# Patient Record
Sex: Male | Born: 1990 | Race: White | Hispanic: No | Marital: Single | Smoking: Current every day smoker
Health system: Southern US, Community
[De-identification: ages and names within clinical notes are randomized; demographics above are authoritative.]

## PROBLEM LIST (undated history)

## (undated) DIAGNOSIS — G2581 Restless legs syndrome: Secondary | ICD-10-CM

## (undated) DIAGNOSIS — Z7189 Other specified counseling: Secondary | ICD-10-CM

## (undated) DIAGNOSIS — M26609 Unspecified temporomandibular joint disorder, unspecified side: Secondary | ICD-10-CM

## (undated) DIAGNOSIS — E860 Dehydration: Secondary | ICD-10-CM

## (undated) DIAGNOSIS — F909 Attention-deficit hyperactivity disorder, unspecified type: Secondary | ICD-10-CM

## (undated) DIAGNOSIS — R5383 Other fatigue: Secondary | ICD-10-CM

## (undated) DIAGNOSIS — F319 Bipolar disorder, unspecified: Secondary | ICD-10-CM

## (undated) DIAGNOSIS — F419 Anxiety disorder, unspecified: Secondary | ICD-10-CM

## (undated) DIAGNOSIS — E785 Hyperlipidemia, unspecified: Secondary | ICD-10-CM

## (undated) DIAGNOSIS — R5381 Other malaise: Secondary | ICD-10-CM

## (undated) DIAGNOSIS — G43909 Migraine, unspecified, not intractable, without status migrainosus: Secondary | ICD-10-CM

## (undated) DIAGNOSIS — F411 Generalized anxiety disorder: Secondary | ICD-10-CM

## (undated) HISTORY — DX: Dehydration: E86.0

## (undated) HISTORY — DX: Other fatigue: R53.83

## (undated) HISTORY — DX: Generalized anxiety disorder: F41.1

## (undated) HISTORY — DX: Hyperlipidemia, unspecified: E78.5

## (undated) HISTORY — DX: Morbid (severe) obesity due to excess calories: E66.01

## (undated) HISTORY — DX: Unspecified temporomandibular joint disorder, unspecified side: M26.609

## (undated) HISTORY — DX: Other specified counseling: Z71.89

## (undated) HISTORY — DX: Migraine, unspecified, not intractable, without status migrainosus: G43.909

## (undated) HISTORY — DX: Other malaise: R53.81

## (undated) HISTORY — DX: Attention-deficit hyperactivity disorder, unspecified type: F90.9

## (undated) HISTORY — DX: Restless legs syndrome: G25.81

## (undated) HISTORY — DX: Hypercalcemia: E83.52

---
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
## 2009-03-20 ENCOUNTER — Emergency Department (HOSPITAL_COMMUNITY): Admission: EM | Admit: 2009-03-20 | Discharge: 2009-03-20 | Payer: Self-pay | Admitting: Emergency Medicine

## 2010-08-22 ENCOUNTER — Emergency Department (HOSPITAL_COMMUNITY): Admission: EM | Admit: 2010-08-22 | Discharge: 2010-08-22 | Payer: Self-pay | Admitting: Emergency Medicine

## 2010-09-20 ENCOUNTER — Ambulatory Visit: Payer: Self-pay | Admitting: Family Medicine

## 2010-09-20 DIAGNOSIS — F4323 Adjustment disorder with mixed anxiety and depressed mood: Secondary | ICD-10-CM

## 2010-09-20 DIAGNOSIS — F17200 Nicotine dependence, unspecified, uncomplicated: Secondary | ICD-10-CM

## 2010-09-30 ENCOUNTER — Ambulatory Visit: Payer: Self-pay | Admitting: Family Medicine

## 2010-09-30 DIAGNOSIS — F988 Other specified behavioral and emotional disorders with onset usually occurring in childhood and adolescence: Secondary | ICD-10-CM | POA: Insufficient documentation

## 2010-10-18 ENCOUNTER — Telehealth (INDEPENDENT_AMBULATORY_CARE_PROVIDER_SITE_OTHER): Payer: Self-pay | Admitting: *Deleted

## 2010-11-03 ENCOUNTER — Ambulatory Visit
Admission: RE | Admit: 2010-11-03 | Discharge: 2010-11-03 | Payer: Self-pay | Source: Home / Self Care | Attending: Family Medicine | Admitting: Family Medicine

## 2010-11-04 ENCOUNTER — Encounter: Payer: Self-pay | Admitting: Family Medicine
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
## 2010-11-22 NOTE — Progress Notes (Signed)
Summary: Mood Questionnaire  Mood Questionnaire   Imported By: Lester Wallowa 09/23/2010 11:25:52  _____________________________________________________________________  External Attachment:    Type:   Image     Comment:   External Document

## 2010-11-24 ENCOUNTER — Ambulatory Visit: Payer: Self-pay | Admitting: Family Medicine
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
## 2010-11-24 NOTE — Progress Notes (Signed)
Summary: Clonazepam refill  Phone Note Refill Request Message from:  Fax from Pharmacy  Refills Requested: Medication #1:  CLONAZEPAM 2 MG TBDP 1/2 tab by mouth two times a day   Dosage confirmed as above?Dosage Confirmed   Brand Name Necessary? No   Supply Requested: 1 month   Last Refilled: 09/20/2010  Method Requested: Electronic Next Appointment Scheduled: 11/01/10 Initial call taken by: Lannette Donath,  October 18, 2010 1:06 PM  Follow-up for Phone Call        Rx faxed to pharmacy Follow-up by: Michell Heinrich M.D.,  October 18, 2010 1:10 PM  Additional Follow-up for Phone Call Additional follow up Details #1::        rx faxed to [facility]. Additional Follow-up by: [name]),  October 18, 2010 2:32 PM

## 2010-11-25 ENCOUNTER — Ambulatory Visit: Payer: Self-pay | Admitting: Family Medicine

## 2010-11-28 ENCOUNTER — Encounter: Payer: Self-pay | Admitting: Family Medicine

## 2010-11-28 ENCOUNTER — Ambulatory Visit (INDEPENDENT_AMBULATORY_CARE_PROVIDER_SITE_OTHER): Payer: Self-pay | Admitting: Family Medicine

## 2010-11-28 DIAGNOSIS — F411 Generalized anxiety disorder: Secondary | ICD-10-CM

## 2010-11-28 DIAGNOSIS — F988 Other specified behavioral and emotional disorders with onset usually occurring in childhood and adolescence: Secondary | ICD-10-CM

## 2010-12-08 ENCOUNTER — Ambulatory Visit: Payer: Self-pay | Admitting: Family Medicine
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
## 2010-12-26 ENCOUNTER — Encounter: Payer: Self-pay | Admitting: Family Medicine

## 2010-12-26 ENCOUNTER — Ambulatory Visit (INDEPENDENT_AMBULATORY_CARE_PROVIDER_SITE_OTHER): Payer: BC Managed Care – PPO | Admitting: Family Medicine

## 2010-12-26 DIAGNOSIS — F411 Generalized anxiety disorder: Secondary | ICD-10-CM

## 2010-12-26 DIAGNOSIS — K5732 Diverticulitis of large intestine without perforation or abscess without bleeding: Secondary | ICD-10-CM

## 2010-12-27 ENCOUNTER — Ambulatory Visit: Payer: Self-pay | Admitting: Family Medicine
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
## 2011-01-04 LAB — DIFFERENTIAL
Basophils Absolute: 0 10*3/uL (ref 0.0–0.1)
Basophils Relative: 0 % (ref 0–1)
Monocytes Absolute: 0.5 10*3/uL (ref 0.1–1.0)
Neutro Abs: 3.4 10*3/uL (ref 1.7–7.7)
Neutrophils Relative %: 62 % (ref 43–77)

## 2011-01-04 LAB — POCT CARDIAC MARKERS
CKMB, poc: <1 ng/mL — ABNORMAL LOW (ref 1.0–8.0)
CKMB, poc: <1 ng/mL — ABNORMAL LOW (ref 1.0–8.0)

## 2011-01-04 LAB — RAPID URINE DRUG SCREEN, HOSP PERFORMED
Amphetamines: NOT DETECTED
Cocaine: NOT DETECTED
Opiates: NOT DETECTED
Tetrahydrocannabinol: POSITIVE — AB

## 2011-01-04 LAB — POCT I-STAT, CHEM 8
Glucose, Bld: 103 mg/dL — ABNORMAL HIGH (ref 70–99)
HCT: 45 % (ref 39.0–52.0)
Hemoglobin: 15.3 g/dL (ref 13.0–17.0)
Potassium: 4.1 mEq/L (ref 3.5–5.1)
Sodium: 138 mEq/L (ref 135–145)

## 2011-01-04 LAB — CBC
MCHC: 34.7 g/dL (ref 30.0–36.0)
Platelets: 190 10*3/uL (ref 150–400)
RDW: 13 % (ref 11.5–15.5)

## 2011-01-15 ENCOUNTER — Encounter: Payer: Self-pay | Admitting: Family Medicine

## 2011-01-25 ENCOUNTER — Other Ambulatory Visit: Payer: Self-pay | Admitting: Family Medicine

## 2011-01-26 ENCOUNTER — Ambulatory Visit (INDEPENDENT_AMBULATORY_CARE_PROVIDER_SITE_OTHER): Payer: BC Managed Care – PPO | Admitting: Family Medicine

## 2011-01-26 ENCOUNTER — Encounter: Payer: Self-pay | Admitting: Family Medicine

## 2011-01-26 VITALS — BP 122/83 | HR 63 | Temp 97.2°F | Ht 72.0 in | Wt 283.0 lb

## 2011-01-26 DIAGNOSIS — F411 Generalized anxiety disorder: Secondary | ICD-10-CM

## 2011-01-26 MED ORDER — LISDEXAMFETAMINE DIMESYLATE 50 MG PO CAPS: 50.0000 mg | ORAL_CAPSULE | ORAL | Status: DC

## 2011-01-26 MED ORDER — BUSPIRONE HCL 15 MG PO TABS: ORAL_TABLET | ORAL | Status: DC

## 2011-01-26 NOTE — Progress Notes (Addendum)
  Subjective:    Patient ID: Jason Flores, male    DOB: [birth year], 20 y.o.   MRN: [id number]  Anxiety Symptoms include decreased concentration, depressed mood (on and off for 2-3 days, quote "my bad days"), excessive worry, irritability, muscle tension, nervous/anxious behavior, panic (in the past but none lately) and restlessness. Patient reports no insomnia or suicidal ideas. Primary symptoms comment: Patient has been on increased dose of citalopram (40mg ) for 66mo now and feels no difference.  Has past history of failed trials of multiple SSRIs (some worsening irritability on some), and failed trials of seroquel xR and Abilify (plus wt gain)  in past. Symptoms occur constantly. The severity of symptoms is moderate. The quality of sleep is good.   Compliance with medications is 76-100%.      Review of Systems  Constitutional: Positive for irritability.  Psychiatric/Behavioral: Positive for decreased concentration. Negative for suicidal ideas. The patient is nervous/anxious. The patient does not have insomnia.       Objective:   Physical Exam Blood pressure 122/83, pulse 63, temperature 97.2 F (36.2 C), temperature source Oral, height 6' (1.829 m), weight 283 lb (128.368 kg), SpO2 95.00%. VS: noted, all normal. Gen: Alert, well appearing, oriented x 4. Calm, lucid thought and speech. No further exam today.       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 He has failed multiple SSRIs, was made worse by some.  Citalopram for 36mo trial has not made any changes for the better or worse. We discussed option of mood stabilizer trial but pt very apprehensive about potential wt gain side effect of the atypical antipsychotics (he gained wt on abilify and seroquel in the past).   We ultimately decided today on a trial of buspar 15mg  bid, recheck in 6  wks.  Continue clonazepam and vyvanse as currently prescribed, vyvanse rx printed today.

## 2011-01-26 NOTE — Assessment & Plan Note (Addendum)
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 He has failed multiple SSRIs, was made worse by some.  Citalopram for 74mo trial has not made any changes for the better or worse. We discussed option of mood stabilizer trial but pt very apprehensive about potential wt gain side effect of the atypical antipsychotics (he gained wt on abilify and seroquel in the past).   We ultimately decided today on a trial of buspar 15mg  bid, recheck in 6 wks.  Continue clonazepam and vyvanse as currently prescribed, vyvanse rx printed today.

## 2011-01-31 LAB — RAPID URINE DRUG SCREEN, HOSP PERFORMED
Amphetamines: NOT DETECTED
Barbiturates: NOT DETECTED
Cocaine: POSITIVE — AB
Opiates: NOT DETECTED
Tetrahydrocannabinol: POSITIVE — AB

## 2011-01-31 LAB — DIFFERENTIAL
Lymphocytes Relative: 31 % (ref 12–46)
Lymphs Abs: 1.7 10*3/uL (ref 0.7–4.0)
Monocytes Absolute: 0.5 10*3/uL (ref 0.1–1.0)
Monocytes Relative: 10 % (ref 3–12)
Neutro Abs: 3 10*3/uL (ref 1.7–7.7)

## 2011-01-31 LAB — URINALYSIS, ROUTINE W REFLEX MICROSCOPIC
Bilirubin Urine: NEGATIVE
Glucose, UA: NEGATIVE mg/dL
Hgb urine dipstick: NEGATIVE
Specific Gravity, Urine: 1.005 (ref 1.005–1.030)
pH: 6 (ref 5.0–8.0)

## 2011-01-31 LAB — CBC
Hemoglobin: 15.8 g/dL (ref 13.0–17.0)
MCHC: 35.2 g/dL (ref 30.0–36.0)
RBC: 5.08 MIL/uL (ref 4.22–5.81)
WBC: 5.4 10*3/uL (ref 4.0–10.5)

## 2011-01-31 LAB — BASIC METABOLIC PANEL
CO2: 25 mEq/L (ref 19–32)
Calcium: 9.7 mg/dL (ref 8.4–10.5)
GFR calc Af Amer: >60 mL/min (ref >60)
GFR calc non Af Amer: >60 mL/min (ref >60)
Sodium: 140 mEq/L (ref 135–145)

## 2011-01-31 LAB — HEPATIC FUNCTION PANEL
Alkaline Phosphatase: 91 U/L (ref 39–117)
Bilirubin, Direct: <0.1 mg/dL (ref 0.0–0.3)
Total Bilirubin: 0.6 mg/dL (ref 0.3–1.2)

## 2011-01-31 LAB — SALICYLATE LEVEL: Salicylate Lvl: <4 mg/dL (ref 2.8–20.0)

## 2011-02-23 ENCOUNTER — Other Ambulatory Visit: Payer: Self-pay | Admitting: *Deleted

## 2011-02-23 MED ORDER — LISDEXAMFETAMINE DIMESYLATE 50 MG PO CAPS: 50.0000 mg | ORAL_CAPSULE | ORAL | Status: DC

## 2011-02-23 NOTE — Telephone Encounter (Signed)
RX signed by Dr. Milinda Cave and placed at front desk.

## 2011-02-23 NOTE — Telephone Encounter (Signed)
Pt called to request refill.  Last filled 01/26/11.  Pt will pick up today or tomorrow.
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
## 2011-03-21 ENCOUNTER — Ambulatory Visit (INDEPENDENT_AMBULATORY_CARE_PROVIDER_SITE_OTHER): Payer: BC Managed Care – PPO | Admitting: Family Medicine

## 2011-03-21 ENCOUNTER — Encounter: Payer: Self-pay | Admitting: Family Medicine

## 2011-03-21 VITALS — BP 130/85 | HR 75 | Temp 97.7°F | Ht 72.0 in | Wt 280.0 lb

## 2011-03-21 DIAGNOSIS — L0293 Carbuncle, unspecified: Secondary | ICD-10-CM

## 2011-03-21 DIAGNOSIS — L0292 Furuncle, unspecified: Secondary | ICD-10-CM

## 2011-03-21 MED ORDER — LISDEXAMFETAMINE DIMESYLATE 50 MG PO CAPS: 50.0000 mg | ORAL_CAPSULE | ORAL | Status: DC

## 2011-03-21 MED ORDER — MUPIROCIN 2 % EX OINT: TOPICAL_OINTMENT | Freq: Three times a day (TID) | CUTANEOUS | Status: AC

## 2011-03-21 NOTE — Progress Notes (Signed)
OFFICE NOTE  03/21/2011  CC:  Chief Complaint  Patient presents with  . Cyst    midline chest, was not there when pt went to bed last night.     HPI:   Patient is a 20 y.o. Caucasian male who is here for bump on chest. Noted upon awakening this a.m., hurts, esp when touched.  No fever.  No insect/spider bite recalled. No similar spots on skin now or in the past. No known hx of MRSA +. Pertinent PMH:  GAD Adult ADHD--nearly due for rf.  MEDS;   Outpatient Prescriptions Prior to Visit  Medication Sig Dispense Refill  . acetaminophen (TYLENOL) 500 MG tablet Take 1,000 mg by mouth as needed.        . busPIRone (BUSPAR) 15 MG tablet 1 tab po bid  60 tablet  1  . clonazePAM (KLONOPIN) 2 MG tablet Take 1 mg by mouth 2 (two) times daily.        Marland Kitchen lisdexamfetamine (VYVANSE) 50 MG capsule Take 1 capsule (50 mg total) by mouth every morning.  30 capsule  0  . citalopram (CELEXA) 40 MG tablet Take 40 mg by mouth daily.          PE: Blood pressure 130/85, pulse 75, temperature 97.7 F (36.5 C), temperature source Oral, height 6' (1.829 m), weight 280 lb (127.007 kg). Gen: Alert, well appearing.  Patient is oriented to person, place, time, and situation. SKIN: central chest with 2mm eryth papule with about a pea sized nodule palpable beneath this--indistinct.  Mild/mod TTP here.  No surrounding erythema, no streaking.  No drainage.    IMPRESSION AND PLAN:  Furuncle Early in the process, no I&D indicated at this time. Discussed importance of heat application to encourage drainage. Bactroban ointment rx'd for tid application to the site x 10d. Call or return if not improving in 2-3d or if develops fever or if unsure.   Also, patient nearly due to come back and pick up routine vyvanse rx RF so I printed this out for him today.  FOLLOW UP:  Return if symptoms worsen or fail to improve.

## 2011-03-21 NOTE — Assessment & Plan Note (Signed)
Early in the process, no I&D indicated at this time. Discussed importance of heat application to encourage drainage. Bactroban ointment rx'd for tid application to the site x 10d. Call or return if not improving in 2-3d or if develops fever or if unsure.

## 2011-03-21 NOTE — Patient Instructions (Signed)
Apply warm/hot pack against the area for 20 minutes at least 2-3 times per day. Take ibuprofen (OTC), 3 tabs (600mg ) every 8 hours until the spot on your chest no longer hurts.

## 2011-04-17 ENCOUNTER — Ambulatory Visit (INDEPENDENT_AMBULATORY_CARE_PROVIDER_SITE_OTHER): Payer: BC Managed Care – PPO | Admitting: Family Medicine

## 2011-04-17 ENCOUNTER — Encounter: Payer: Self-pay | Admitting: Family Medicine

## 2011-04-17 VITALS — BP 117/83 | HR 89 | Temp 98.1°F | Ht 72.0 in | Wt 277.4 lb

## 2011-04-17 DIAGNOSIS — J029 Acute pharyngitis, unspecified: Secondary | ICD-10-CM

## 2011-04-17 LAB — POCT RAPID STREP A (OFFICE): Rapid Strep A Screen: NEGATIVE

## 2011-04-17 MED ORDER — LISDEXAMFETAMINE DIMESYLATE 50 MG PO CAPS: 50.0000 mg | ORAL_CAPSULE | ORAL | Status: DC

## 2011-04-17 MED ORDER — PSEUDOEPH-CHLORPHEN-HYDROCOD 60-4-5 MG/5ML PO SOLN: 5.0000 mL | Freq: Four times a day (QID) | ORAL | Status: DC | PRN

## 2011-04-17 NOTE — Progress Notes (Signed)
OFFICE NOTE  04/17/2011  CC:  Chief Complaint  Patient presents with  . Fever    X 3 days  . Migraine    X 3 days  . Sore Throat    X 3 days  . Otalgia    X 3 days- both ears     HPI:   Patient is a 20 y.o. Caucasian male who is here for sore throat. Onset about 36 hours ago--HA, ST, runny nose and congestion, subjective f/c (no temp checked), diffuse achiness. No rash, no cough.  No known sick contacts.  Was feeling well prior to onset of symptoms (denies low grade URI or allergic rhinitis sx's prior).  Pertinent PMH:  Past Medical History  Diagnosis Date  . Asthma     Childhood; quiescent since age 76  . ADHD (attention deficit hyperactivity disorder)     treated successfully in middle and HS with adderall    MEDS;   Outpatient Prescriptions Prior to Visit  Medication Sig Dispense Refill  . acetaminophen (TYLENOL) 500 MG tablet Take 1,000 mg by mouth as needed.        . busPIRone (BUSPAR) 15 MG tablet 1 tab po bid  60 tablet  1  . clonazePAM (KLONOPIN) 2 MG tablet Take 1 mg by mouth 2 (two) times daily.        Marland Kitchen lisdexamfetamine (VYVANSE) 50 MG capsule Take 1 capsule (50 mg total) by mouth every morning.  30 capsule  0    PE: Blood pressure 117/83, pulse 89, temperature 98.1 F (36.7 C), temperature source Oral, height 6' (1.829 m), weight 277 lb 6.4 oz (125.828 kg), SpO2 97.00%. VS: noted--normal. Gen: alert, NAD, NONTOXIC APPEARING. HEENT: eyes without injection, drainage, or swelling.  Ears: EACs clear, TMs with normal light reflex and landmarks.  Nose: Clear rhinorrhea, with some dried, crusty exudate adherent to mildly injected mucosa.  No purulent d/c.  No paranasal sinus TTP.  No facial swelling.  Throat and mouth without focal lesion.  Mild diffuse pharyngial swelling, with slightly deeper pink hue than oral cavity but no significant erythema, no pus.  Some clear PND is noted. Neck: supple, no LAD.  Diffusely TTP anteriorly.  LUNGS: CTA bilat, nonlabored  resps.   CV: RRR, no m/r/g. EXT: no c/c/e SKIN: no rash  Rapid strep today: NEGATIVE  IMPRESSION AND PLAN:  Pharyngitis, acute Viral syndrome/pharyngitis. Sent throat culture today. Symptomatic care discussed, rx for zutripro with coupon given, encouraged rest, hydration, out of work today.  May return tomorrow if no fever today or tonight.     FOLLOW UP:  Return if symptoms worsen or fail to improve.

## 2011-04-17 NOTE — Progress Notes (Signed)
Addended by: Baldemar Lenis R on: 04/17/2011 12:03 PM   Modules accepted: Orders

## 2011-04-17 NOTE — Assessment & Plan Note (Signed)
Viral syndrome/pharyngitis. Sent throat culture today. Symptomatic care discussed, rx for zutripro with coupon given, encouraged rest, hydration, out of work today.  May return tomorrow if no fever today or tonight.

## 2011-05-03 ENCOUNTER — Other Ambulatory Visit: Payer: Self-pay | Admitting: Family Medicine

## 2011-05-03 MED ORDER — CLONAZEPAM 2 MG PO TABS: 2.0000 mg | ORAL_TABLET | Freq: Two times a day (BID) | ORAL | Status: DC | PRN

## 2011-05-03 NOTE — Telephone Encounter (Signed)
Jason Flores has misplaced his Klonopin and his Grandmother is in urgent need of him getting more. She needs for the nurse to call   [facility] and ask  for lisa for early release of that medication.

## 2011-05-03 NOTE — Telephone Encounter (Signed)
Just a bit too early. We can release it on Friday 05/05/11.

## 2011-05-03 NOTE — Telephone Encounter (Signed)
I spoke to [facility] at pharmacy for most recent fill date.  Per Misty Stanley he last filled on 04/10/11.  Pt is taking 1 bid.  Pt was given #60 x3 on 03/15/11 so he is not needing more refills.  Pt does not have previous refill abuse with our office or at pharmacy per [facility].  Is it OK to release early fill?

## 2011-05-03 NOTE — Telephone Encounter (Signed)
I spoke to Dr. Abner Greenspan who did give verbal consent for 2 pills (one for tonight and one for tomorrow morning) and we will defer to Dr. Milinda Cave who knows the patient better.  I spoke with [name] and she is agreeable with this plan.  [facility] [place]

## 2011-05-04 ENCOUNTER — Other Ambulatory Visit: Payer: Self-pay | Admitting: Family Medicine

## 2011-05-04 MED ORDER — CLONAZEPAM 2 MG PO TABS: 1.0000 mg | ORAL_TABLET | Freq: Two times a day (BID) | ORAL | Status: DC

## 2011-05-04 NOTE — Telephone Encounter (Signed)
Rx printed for 87mo supply but he is due for office f/u to discuss anxiety prior to any FURTHER RF.

## 2011-05-04 NOTE — Telephone Encounter (Signed)
[facility] stating [facility] was previously on 2mg  bid and RX received today was only for 1/2 tablet daily.  Per [facility] pt was increased to 1 tab bid on 11/28/10.  Refill was sent to pharm on 5/23 (signed faxed request from pharm), completed form not scanned into [facility] or entered into [facility].  Advised Misty Stanley that RX should be for 1 daily.  She will correct.

## 2011-05-04 NOTE — Telephone Encounter (Signed)
Jason Flores at pharmacy notified 30day RX faxed and to delete existing RX w/refills from pt's profile.  She will do this.  [name] notified that new RX at [facility] and [facility] will need to call our office for appt before more refills are given.  She is agreeable and will have [name] call.

## 2011-05-04 NOTE — Telephone Encounter (Signed)
Addended by: Luisa Dago on: 05/04/2011 10:37 AM   Modules accepted: Orders

## 2011-05-22 ENCOUNTER — Ambulatory Visit (INDEPENDENT_AMBULATORY_CARE_PROVIDER_SITE_OTHER): Payer: BC Managed Care – PPO | Admitting: Family Medicine

## 2011-05-22 ENCOUNTER — Encounter: Payer: Self-pay | Admitting: Family Medicine

## 2011-05-22 VITALS — BP 125/83 | HR 87 | Temp 97.6°F | Ht 72.0 in | Wt 276.0 lb

## 2011-05-22 DIAGNOSIS — B3789 Other sites of candidiasis: Secondary | ICD-10-CM

## 2011-05-22 DIAGNOSIS — B379 Candidiasis, unspecified: Secondary | ICD-10-CM | POA: Insufficient documentation

## 2011-05-22 DIAGNOSIS — F988 Other specified behavioral and emotional disorders with onset usually occurring in childhood and adolescence: Secondary | ICD-10-CM

## 2011-05-22 DIAGNOSIS — F411 Generalized anxiety disorder: Secondary | ICD-10-CM

## 2011-05-22 MED ORDER — NYSTATIN 100000 UNIT/GM EX CREA: TOPICAL_CREAM | CUTANEOUS | Status: DC

## 2011-05-22 MED ORDER — LISDEXAMFETAMINE DIMESYLATE 50 MG PO CAPS: 50.0000 mg | ORAL_CAPSULE | ORAL | Status: DC

## 2011-05-22 NOTE — Assessment & Plan Note (Signed)
Problem stable.  Continue vyvanse 50mg  qd, #30, no RF--rx given today.  We have reviewed our general long term plan for this problem and also reviewed symptoms and signs that should prompt the patient to call or return to the office.

## 2011-05-22 NOTE — Assessment & Plan Note (Signed)
Left axillary region with candidal intertrigo. Rx'd nystatin cream bid x 14d and recommended drying out of the area as much as possible.

## 2011-05-22 NOTE — Assessment & Plan Note (Signed)
Problem stable.  Continue clonazepam 2mg  q12h.  He may completely stop his buspar.  We have reviewed our general long term plan for this problem and also reviewed symptoms and signs that should prompt the patient to call or return to the office.

## 2011-05-22 NOTE — Progress Notes (Signed)
OFFICE NOTE  05/22/2011  CC:  Chief Complaint  Patient presents with  . Rash    under left arm  . Medication Refill     HPI:   Patient is a 20 y.o. Caucasian male who is here for f/u GAD and ADHD, plus has a rash. Doing well, says mood has stabilized lately--"life is getting much easier".  Takes 1 clonaz bid and finds it very helpful, also says concentration and inner restlessness/nervousness much improved/stable on vyvanse, which he takes daily. Appetite is good.  Dry mouth is the only side effect he feels from vyvanse. Says Buspar, which we added about 3 mo/ago for ongoing irritability/nervousness, causes excessive sedation and he has only been able to tolerate qd dosing for spurts of time, does not note any improvement from it. Also has c/o 1 wk hx of itchy, burning rash in left armpit.  Nothing in other armpit.  No help from change to scentless deoderant.  Applied one of his GMs creams yesterday x 1.  No hx of similar rash in the past.  Pertinent PMH:  GAD Adjustment d/o with depression/anxiety (question of past dx of bipolar d/o). ADHD, adult. Tobacco dependence. MEDS;   Outpatient Prescriptions Prior to Visit  Medication Sig Dispense Refill  . acetaminophen (TYLENOL) 500 MG tablet Take 1,000 mg by mouth as needed.        . clonazePAM (KLONOPIN) 2 MG tablet Take 2 mg by mouth 2 (two) times daily.        . Pseudoeph-Chlorphen-Hydrocod (ZUTRIPRO) 60-4-5 MG/5ML SOLN Take 5 mLs by mouth every 6 (six) hours as needed.  120 mL  0  . busPIRone (BUSPAR) 15 MG tablet 1 tab po bid  60 tablet  1  . lisdexamfetamine (VYVANSE) 50 MG capsule Take 1 capsule (50 mg total) by mouth every morning.  30 capsule  0    PE: Blood pressure 125/83, pulse 87, temperature 97.6 F (36.4 C), temperature source Oral, height 6' (1.829 m), weight 276 lb (125.193 kg). VITALS: Blood pressure 125/83, pulse 87, temperature 97.6 F (36.4 C), temperature source Oral, height 6' (1.829 m), weight 276 lb (125.193  kg). Wt Readings from Last 2 Encounters:  05/22/11 276 lb (125.193 kg)  04/17/11 277 lb 6.4 oz (125.828 kg)    Gen: alert, oriented x 4, affect pleasant.  Lucid thinking and conversation noted. HEENT: PERRLA, EOMI.   Neck: no LAD, mass, or thyromegaly. CV: RRR, no m/r/g LUNGS: CTA bilat, nonlabored. NEURO: no tremor or tics noted on observation.  Coordination intact. CN 2-12 grossly intact bilaterally, strength 5/5 in all extremeties.  No ataxia. SKIN: left axilla with erythematous macular rash with well-demarcated borders and satellite lesions, slight desquamation noted.  IMPRESSION AND PLAN:  GENERALIZED ANXIETY DISORDER Problem stable.  Continue clonazepam 2mg  q12h.  He may completely stop his buspar.  We have reviewed our general long term plan for this problem and also reviewed symptoms and signs that should prompt the patient to call or return to the office.   ATTENTION DEFICIT DISORDER, ADULT Problem stable.  Continue vyvanse 50mg  qd, #30, no RF--rx given today.  We have reviewed our general long term plan for this problem and also reviewed symptoms and signs that should prompt the patient to call or return to the office.   Candida infection Left axillary region with candidal intertrigo. Rx'd nystatin cream bid x 14d and recommended drying out of the area as much as possible.     FOLLOW UP:  Return in about  4 months (around 09/22/2011) for morning appt for CPE and fasting lab work.

## 2011-06-01 ENCOUNTER — Other Ambulatory Visit: Payer: Self-pay | Admitting: Family Medicine

## 2011-06-01 NOTE — Telephone Encounter (Signed)
Patient said the pharmacy will not let him pick up the med without a doctor's visit. Patient said that in last OV with Dr Milinda Cave that his next refill would be okay, Patient is leaving for the beach Friday afternoon & needs his refill before he leaves.

## 2011-06-02 MED ORDER — CLONAZEPAM 2 MG PO TABS: 2.0000 mg | ORAL_TABLET | Freq: Two times a day (BID) | ORAL | Status: DC

## 2011-06-02 NOTE — Telephone Encounter (Signed)
Reviewing his rx he is not asking for it significantly early. OK to give with same sig and same # for just one month, no further refills

## 2011-06-02 NOTE — Telephone Encounter (Signed)
Pt notified RX will be called to pharm.  He does not need an appt until scheduled appt in November.  RX called to [facility].

## 2011-06-02 NOTE — Telephone Encounter (Signed)
Last filled 05/04/11.  Next follow up (CPE) 09/21/11, last seen 05/22/11.  Please advise.

## 2011-06-27 ENCOUNTER — Other Ambulatory Visit: Payer: Self-pay | Admitting: Family Medicine

## 2011-06-27 MED ORDER — LISDEXAMFETAMINE DIMESYLATE 50 MG PO CAPS: 50.0000 mg | ORAL_CAPSULE | ORAL | Status: DC

## 2011-06-27 NOTE — Telephone Encounter (Signed)
Message left on voice mail that RX ready to be picked up.

## 2011-06-27 NOTE — Telephone Encounter (Signed)
Patient needs a refill on med

## 2011-07-03 ENCOUNTER — Other Ambulatory Visit: Payer: Self-pay | Admitting: *Deleted

## 2011-07-03 MED ORDER — CLONAZEPAM 2 MG PO TABS: 2.0000 mg | ORAL_TABLET | Freq: Two times a day (BID) | ORAL | Status: DC

## 2011-07-03 NOTE — Telephone Encounter (Signed)
Pt last seen on 05/22/11, 4 month follow up (CPE) scheduled on 09/21/11.  Med last filled on 06/02/11.  Is this OK to refill.

## 2011-07-03 NOTE — Telephone Encounter (Signed)
Ok to RF--PM

## 2011-07-04 MED ORDER — CLONAZEPAM 2 MG PO TABS: 2.0000 mg | ORAL_TABLET | Freq: Two times a day (BID) | ORAL | Status: DC

## 2011-07-04 NOTE — Telephone Encounter (Signed)
[facility]  Per Dr. Milinda Cave OK to fill through followup in November.

## 2011-07-26 ENCOUNTER — Other Ambulatory Visit: Payer: Self-pay | Admitting: *Deleted

## 2011-07-26 NOTE — Telephone Encounter (Signed)
Pt requests refill on Vyvanse.  Advised Dr. Milinda Cave out of office this PM and it will be tomorrow when this is done.  Pt agreeable.

## 2011-07-27 MED ORDER — LISDEXAMFETAMINE DIMESYLATE 50 MG PO CAPS: 50.0000 mg | ORAL_CAPSULE | ORAL | Status: DC

## 2011-07-27 NOTE — Telephone Encounter (Signed)
Message left RX ready.

## 2011-08-07 LAB — HEPATIC FUNCTION PANEL
Albumin: 4.1
Total Protein: 7.1

## 2011-08-07 LAB — DIFFERENTIAL
Eosinophils Absolute: 0.1
Lymphs Abs: 2.1
Neutrophils Relative %: 58

## 2011-08-07 LAB — BASIC METABOLIC PANEL
BUN: 4 — ABNORMAL LOW
Calcium: 8.9
Chloride: 103
Creatinine, Ser: 0.88

## 2011-08-07 LAB — ETHANOL: Alcohol, Ethyl (B): 112 — ABNORMAL HIGH

## 2011-08-07 LAB — CBC
MCV: 83.7
Platelets: 240
WBC: 6.4

## 2011-08-07 LAB — RAPID URINE DRUG SCREEN, HOSP PERFORMED
Barbiturates: NOT DETECTED
Benzodiazepines: POSITIVE — AB

## 2011-08-07 LAB — URINALYSIS, ROUTINE W REFLEX MICROSCOPIC
Glucose, UA: NEGATIVE
Hgb urine dipstick: NEGATIVE
Specific Gravity, Urine: 1.014

## 2011-08-07 LAB — GC/CHLAMYDIA PROBE AMP, URINE
Chlamydia, Swab/Urine, PCR: NEGATIVE
GC Probe Amp, Urine: NEGATIVE

## 2011-08-07 LAB — TSH: TSH: 1.118

## 2011-08-07 LAB — CULTURE, ROUTINE-ABSCESS

## 2011-08-07 LAB — ACETAMINOPHEN LEVEL: Acetaminophen (Tylenol), Serum: <10 — ABNORMAL LOW

## 2011-08-07 LAB — SALICYLATE LEVEL: Salicylate Lvl: <4

## 2011-08-07 LAB — RPR: RPR Ser Ql: NONREACTIVE

## 2011-08-07 LAB — GAMMA GT: GGT: 24

## 2011-08-23 ENCOUNTER — Other Ambulatory Visit: Payer: Self-pay | Admitting: Family Medicine

## 2011-08-23 MED ORDER — LISDEXAMFETAMINE DIMESYLATE 50 MG PO CAPS: 50.0000 mg | ORAL_CAPSULE | ORAL | Status: DC

## 2011-08-23 NOTE — Telephone Encounter (Signed)
Pt notified RX at front desk.

## 2011-08-23 NOTE — Telephone Encounter (Signed)
Last seen 05/22/11, pt has fasting CPE on 09/21/11.  Vyvanse last filled on 07/26/11.

## 2011-08-23 NOTE — Telephone Encounter (Signed)
Patient going out of town this Friday for a week, needs to fill Vyvanse before he leaves

## 2011-09-19 ENCOUNTER — Other Ambulatory Visit: Payer: Self-pay | Admitting: *Deleted

## 2011-09-19 MED ORDER — LISDEXAMFETAMINE DIMESYLATE 50 MG PO CAPS: 50.0000 mg | ORAL_CAPSULE | ORAL | Status: DC

## 2011-09-19 NOTE — Telephone Encounter (Signed)
Last seen 05/22/11 for acute appt, pt had fasting CPE scheduled on 09/21/11, but has cancelled appt.  No other appt's in system.  Vyvanse last filled on 08/23/11.

## 2011-09-19 NOTE — Telephone Encounter (Signed)
I'll do this rx but he needs office f/u prior to next RF request.--PM

## 2011-09-20 NOTE — Telephone Encounter (Signed)
RC from pt.  Advised RX ready and he will need office visit prior to more refills.  He will call back to schedule.

## 2011-09-20 NOTE — Telephone Encounter (Signed)
I have attempted to contact this patient by phone with the following results: left message to return my call on answering machine (mobile).  

## 2011-09-21 ENCOUNTER — Encounter: Payer: BC Managed Care – PPO | Admitting: Family Medicine

## 2011-09-29 ENCOUNTER — Other Ambulatory Visit: Payer: Self-pay | Admitting: *Deleted

## 2011-09-29 MED ORDER — CLONAZEPAM 2 MG PO TABS: 2.0000 mg | ORAL_TABLET | Freq: Two times a day (BID) | ORAL | Status: DC

## 2011-09-29 NOTE — Telephone Encounter (Signed)
Called to [place] at [facility].

## 2011-09-29 NOTE — Telephone Encounter (Signed)
Last seen 05/22/11 for acute appt, pt had fasting CPE scheduled on 09/21/11, but has cancelled appt.  We filled Vyvanse on 11/29 with condition pt needs to make appt.  OK to fill once?

## 2011-09-29 NOTE — Telephone Encounter (Signed)
Yes, okay to fill once.  Thx--PM

## 2011-10-27 ENCOUNTER — Encounter: Payer: Self-pay | Admitting: Family Medicine

## 2011-10-27 ENCOUNTER — Ambulatory Visit (INDEPENDENT_AMBULATORY_CARE_PROVIDER_SITE_OTHER): Payer: BC Managed Care – PPO | Admitting: Family Medicine

## 2011-10-27 VITALS — BP 137/88 | HR 67 | Ht 72.0 in | Wt 270.0 lb

## 2011-10-27 DIAGNOSIS — G43009 Migraine without aura, not intractable, without status migrainosus: Secondary | ICD-10-CM

## 2011-10-27 DIAGNOSIS — Z23 Encounter for immunization: Secondary | ICD-10-CM

## 2011-10-27 DIAGNOSIS — F411 Generalized anxiety disorder: Secondary | ICD-10-CM

## 2011-10-27 DIAGNOSIS — F988 Other specified behavioral and emotional disorders with onset usually occurring in childhood and adolescence: Secondary | ICD-10-CM

## 2011-10-27 MED ORDER — CLONAZEPAM 2 MG PO TABS: 2.0000 mg | ORAL_TABLET | Freq: Two times a day (BID) | ORAL | Status: DC

## 2011-10-27 MED ORDER — RIZATRIPTAN BENZOATE 10 MG PO TABS: 10.0000 mg | ORAL_TABLET | ORAL | Status: DC | PRN

## 2011-10-27 MED ORDER — LISDEXAMFETAMINE DIMESYLATE 50 MG PO CAPS: 50.0000 mg | ORAL_CAPSULE | ORAL | Status: DC

## 2011-10-27 NOTE — Progress Notes (Signed)
OFFICE NOTE  10/30/2011  CC:  Chief Complaint  Patient presents with  . Follow-up    ADD, Anxiety  . Migraine    x 3 days, has taken ibuprofen, goody's, tylenol     HPI:   Patient is a 21 y.o. Caucasian male who is here for f/u ADHD, GAD. Feels like vyvanse continues to be adequately helpful at current dose.  Same for clonazepam. Has a young child, is getting less sleep at night.  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  With this, he has noted resurgence of his migraine HA's over the last 82mo.  Has had only 1-2 per month but they last 3-7d with waxing and waning intensity.  Describes band-like distribution of sharp pain, also retro-orbital area diffusely, +photo and phonophobia.  No nausea, no dizziness.  These HA's do NOT awaken him from sleep.  Denies any warning signs/sx's or any sx's that could be interpreted as an aura.  No visual sx's.    Pertinent PMH:  Past Medical History  Diagnosis Date  . Asthma     Childhood; quiescent since age 89  . ADHD (attention deficit hyperactivity disorder)     treated successfully in middle and HS with adderall  Migraine HA's--these have not bothered him in the last couple of years until the last 2 mo. GAD  Past family and social history reviewed and there are no changes since the patient's last office visit with me.  MEDS;   Outpatient Prescriptions Prior to Visit  Medication Sig Dispense Refill  . acetaminophen (TYLENOL) 500 MG tablet Take 1,000 mg by mouth as needed.        . Pseudoeph-Chlorphen-Hydrocod (ZUTRIPRO) 60-4-5 MG/5ML SOLN Take 5 mLs by mouth every 6 (six) hours as needed.  120 mL  0  . clonazePAM (KLONOPIN) 2 MG tablet Take 1 tablet (2 mg total) by mouth 2 (two) times daily.  60 tablet  0  . lisdexamfetamine (VYVANSE) 50 MG capsule Take 1 capsule (50 mg total) by mouth every morning.  30 capsule  0  . nystatin (MYCOSTATIN) cream Apply twice daily to left underarm area for 14d  30 g  1    ROS: no chest pain, no SOB, no focal weakness, no palpitations  PE: Blood pressure 137/88, pulse 67, height 6' (1.829 m), weight 270 lb (122.471 kg), SpO2 98.00%. Wt Readings from Last 2 Encounters:  10/27/11 270 lb (122.471 kg)  05/22/11 276 lb (125.193 kg)  Gen: alert, oriented x 4, affect pleasant.  Lucid thinking and conversation noted. HEENT: PERRLA, EOMI.   Neck: no LAD, mass, or thyromegaly. CV: RRR, no m/r/g LUNGS: CTA bilat, nonlabored. NEURO: no tremor or tics noted on observation.  Coordination intact. CN 2-12 grossly intact bilaterally, strength 5/5 in all extremeties.  No ataxia.  Labs today: none  IMPRESSION AND PLAN:  Migraine headache without aura Trial of Maxalt 10mg , 1 tab po q2h prn, max 30mg  in 24h.  Therapeutic expectations and side effect profile of medication discussed today.  Patient's questions answered. Discussed importance of trigger avoidance, which for him is trying to avoid sleep deprivation. These are not frequent enough at this time to warrant trial of migraine prophylaxis med.  GENERALIZED ANXIETY DISORDER Problem stable.  Continue current medications and diet appropriate for this condition.  We have reviewed our general long term plan for this problem and also reviewed symptoms and signs that should prompt the patient to call or return to the office.  ATTENTION DEFICIT DISORDER, ADULT Problem stable.  Continue current medications and diet appropriate for this condition.  We have reviewed our general long term plan for this problem and also reviewed symptoms and signs that should prompt the patient to call or return to the office. Rx's for the next 85mo given today with the appropriate "fill on or after" date information, for pt or pharmacy to hold.  Call in 3 mo for RF request. F/u in office 109mo.   Flu vaccine IM today.  FOLLOW UP:  Return in about 6 months (around 04/25/2012) for f/u.

## 2011-10-30 ENCOUNTER — Encounter: Payer: Self-pay | Admitting: Family Medicine

## 2011-10-30 DIAGNOSIS — G43009 Migraine without aura, not intractable, without status migrainosus: Secondary | ICD-10-CM | POA: Insufficient documentation

## 2011-10-30 NOTE — Assessment & Plan Note (Signed)
Problem stable.  Continue current medications and diet appropriate for this condition.  We have reviewed our general long term plan for this problem and also reviewed symptoms and signs that should prompt the patient to call or return to the office. Rx's for the next 756mo given today with the appropriate "fill on or after" date information, for pt or pharmacy to hold.  Call in 3 mo for RF request. F/u in office 56mo.

## 2011-10-30 NOTE — Assessment & Plan Note (Signed)
Trial of Maxalt 10mg , 1 tab po q2h prn, max 30mg  in 24h.  Therapeutic expectations and side effect profile of medication discussed today.  Patient's questions answered. Discussed importance of trigger avoidance, which for him is trying to avoid sleep deprivation. These are not frequent enough at this time to warrant trial of migraine prophylaxis med.

## 2011-10-30 NOTE — Assessment & Plan Note (Signed)
Problem stable.  Continue current medications and diet appropriate for this condition.  We have reviewed our general long term plan for this problem and also reviewed symptoms and signs that should prompt the patient to call or return to the office.  

## 2011-12-11 ENCOUNTER — Encounter: Payer: Self-pay | Admitting: Family Medicine

## 2011-12-11 ENCOUNTER — Ambulatory Visit (INDEPENDENT_AMBULATORY_CARE_PROVIDER_SITE_OTHER): Payer: BC Managed Care – PPO | Admitting: Family Medicine

## 2011-12-11 VITALS — BP 124/77 | HR 98 | Temp 97.9°F | Ht 72.0 in | Wt 259.0 lb

## 2011-12-11 DIAGNOSIS — J209 Acute bronchitis, unspecified: Secondary | ICD-10-CM

## 2011-12-11 MED ORDER — HYDROCODONE-HOMATROPINE 5-1.5 MG/5ML PO SYRP: ORAL_SOLUTION | ORAL | Status: AC

## 2011-12-11 NOTE — Assessment & Plan Note (Signed)
Self-limited nature of this illness was discussed, questions answered.  Discussed symptomatic care, rest, fluids.   Warning signs/symptoms of worsening illness were discussed.  Patient instructed to call or return if any of these occur. Sampes of Norel CS given today, 1 tsp q4-6h.   Tylenol 1000 mg q6h.  Hycodan 1-2 tsp qhs prn.  Therapeutic expectations and side effect profile of medication discussed today.  Patient's questions answered.

## 2011-12-11 NOTE — Progress Notes (Signed)
OFFICE NOTE  12/11/2011  CC:  Chief Complaint  Patient presents with  . URI    cough, HA, ? fever since Friday     HPI: Patient is a 21 y.o. Caucasian male who is here for cough/HA. Pt presents complaining of respiratory symptoms for 3 days.  Mostly nasal congestion/runny nose, sneezing, cough.  Worst symptoms seems to be the cough, ST.  Lately the symptoms seem to be worsening. Subjective f/c last hs.  No wheezing and no SOB.  No pain in face or teeth.  HA. Symptoms made worse by cold.  Symptoms improved by nothing. Smoker? yes Recent sick contact? unknown Muscle or joint aches? Slight diffuse achiness.   Flu shot this season at least 2 wks ago? yes  ROS: no n/v/d or abdominal pain.  No rash.  No neck stiffness.   +Mild fatigue.  +Mild appetite loss.    Pertinent PMH:  Past Medical History  Diagnosis Date  . Asthma     Childhood; quiescent since age 46  . ADHD (attention deficit hyperactivity disorder)     treated successfully in middle and HS with adderall; has done well with switch over to vyvanse  . GAD (generalized anxiety disorder)     Mixed sx's (anxiety/panic/irritability/depression) in the past resulted in trials of several SSRIs, which apparently made him more irritable.  Trials of seroquel and abilify resulted in excessive wt gain and didn't help much.  Buspar was oversedating.  Citalopram 40mg  qd x 51mo NO HELP (2012)--ultimately decided to stick with clonaz bid and this has been effective.  . Migraine syndrome     MEDS:  Outpatient Prescriptions Prior to Visit  Medication Sig Dispense Refill  . acetaminophen (TYLENOL) 500 MG tablet Take 1,000 mg by mouth as needed.        . clonazePAM (KLONOPIN) 2 MG tablet Take 1 tablet (2 mg total) by mouth 2 (two) times daily.  60 tablet  5  . lisdexamfetamine (VYVANSE) 50 MG capsule Take 1 capsule (50 mg total) by mouth every morning. Fill on or after 12/25/11  30 capsule  0  . Pseudoeph-Chlorphen-Hydrocod (ZUTRIPRO) 60-4-5  MG/5ML SOLN Take 5 mLs by mouth every 6 (six) hours as needed.  120 mL  0  . rizatriptan (MAXALT) 10 MG tablet Take 1 tablet (10 mg total) by mouth as needed for migraine. May repeat in 2 hours if needed  9 tablet  3    PE: There were no vitals taken for this visit. VS: noted--normal. Gen: alert, NAD, NONTOXIC APPEARING. HEENT: eyes without injection, drainage, or swelling.  Ears: EACs clear, TMs with normal light reflex and landmarks.  Nose: Clear rhinorrhea, with some dried, crusty exudate adherent to mildly injected mucosa.  No purulent d/c.  No paranasal sinus TTP.  No facial swelling.  Throat and mouth without focal lesion.  No pharyngial swelling, erythema, or exudate.   Neck: supple, no LAD.   LUNGS: CTA bilat, nonlabored resps.   CV: RRR, no m/r/g. EXT: no c/c/e SKIN: no rash    IMPRESSION AND PLAN: Acute bronchitis Self-limited nature of this illness was discussed, questions answered.  Discussed symptomatic care, rest, fluids.   Warning signs/symptoms of worsening illness were discussed.  Patient instructed to call or return if any of these occur. Sampes of Norel CS given today, 1 tsp q4-6h.   Tylenol 1000 mg q6h.  Hycodan 1-2 tsp qhs prn.  Therapeutic expectations and side effect profile of medication discussed today.  Patient's questions answered.  FOLLOW UP: prn

## 2011-12-13 ENCOUNTER — Ambulatory Visit (INDEPENDENT_AMBULATORY_CARE_PROVIDER_SITE_OTHER): Payer: BC Managed Care – PPO | Admitting: Family Medicine

## 2011-12-13 ENCOUNTER — Encounter: Payer: Self-pay | Admitting: Family Medicine

## 2011-12-13 VITALS — BP 132/84 | HR 77 | Temp 98.0°F | Ht 72.0 in | Wt 252.1 lb

## 2011-12-13 DIAGNOSIS — J40 Bronchitis, not specified as acute or chronic: Secondary | ICD-10-CM

## 2011-12-13 DIAGNOSIS — J209 Acute bronchitis, unspecified: Secondary | ICD-10-CM

## 2011-12-13 MED ORDER — METHYLPREDNISOLONE 4 MG PO KIT: PACK | ORAL | Status: AC

## 2011-12-13 MED ORDER — ALBUTEROL SULFATE HFA 108 (90 BASE) MCG/ACT IN AERS: 2.0000 | INHALATION_SPRAY | RESPIRATORY_TRACT | Status: DC | PRN

## 2011-12-13 MED ORDER — AMOXICILLIN-POT CLAVULANATE 875-125 MG PO TABS: 1.0000 | ORAL_TABLET | Freq: Two times a day (BID) | ORAL | Status: AC

## 2011-12-13 NOTE — Patient Instructions (Signed)

## 2011-12-17 ENCOUNTER — Encounter: Payer: Self-pay | Admitting: Family Medicine
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
## 2011-12-17 NOTE — Assessment & Plan Note (Signed)
Symptoms notably worse and diffuse wheeezing on exam. Is given an rx of Medrol dose pack and Augmentin, use albuterol prn and return if symptoms do not improve.

## 2012-01-23 ENCOUNTER — Other Ambulatory Visit: Payer: Self-pay | Admitting: Family Medicine

## 2012-01-23 MED ORDER — LISDEXAMFETAMINE DIMESYLATE 50 MG PO CAPS: 50.0000 mg | ORAL_CAPSULE | ORAL | Status: DC

## 2012-01-23 NOTE — Telephone Encounter (Signed)
Pt grandmother notified RX ready by Sedalia Muta.

## 2012-01-23 NOTE — Telephone Encounter (Signed)
Vyvanse, please call pt when ready to pickup (per Diane Tomerlin)  Last filled 10/27/11 for 3 months Last seen 10/27/11 Follow up scheduled on 04/22/12. RX printed for 4/2, 5/2 and 6/2 and signed by Dr. Milinda Cave.

## 2012-04-16 ENCOUNTER — Other Ambulatory Visit: Payer: Self-pay | Admitting: Family Medicine

## 2012-04-16 NOTE — Telephone Encounter (Signed)
Patient is requesting Vyvanse & Klonopin refills

## 2012-04-16 NOTE — Telephone Encounter (Signed)
Refill request for KLONOPIN Last filled 10/27/11, #60 x 5  Refill request for VYVANSE Last filled- written on 01/23/12 with note to fill on or after 6/2  Last seen-10/27/11 Follow up - scheduled for 04/22/12.  I called pt and he has enough to last until that appt.  He is going out of town and will need refill while out of town.  He can wait for Vyvanse until then.  Pt will need Klonopin prior to that time.

## 2012-04-17 MED ORDER — CLONAZEPAM 2 MG PO TABS: 2.0000 mg | ORAL_TABLET | Freq: Two times a day (BID) | ORAL | Status: DC

## 2012-04-17 NOTE — Telephone Encounter (Signed)
Noted.  Klonopin rx sent.  Will do vyvanse RF when he come in for his scheduled f/u.

## 2012-04-17 NOTE — Telephone Encounter (Signed)
RX faxed

## 2012-04-22 ENCOUNTER — Ambulatory Visit (INDEPENDENT_AMBULATORY_CARE_PROVIDER_SITE_OTHER): Payer: BC Managed Care – PPO | Admitting: Family Medicine

## 2012-04-22 ENCOUNTER — Ambulatory Visit: Payer: BC Managed Care – PPO | Admitting: Family Medicine

## 2012-04-22 ENCOUNTER — Encounter: Payer: Self-pay | Admitting: Family Medicine

## 2012-04-22 VITALS — BP 136/81 | HR 71 | Ht 72.0 in | Wt 261.0 lb

## 2012-04-22 DIAGNOSIS — F411 Generalized anxiety disorder: Secondary | ICD-10-CM

## 2012-04-22 DIAGNOSIS — Z23 Encounter for immunization: Secondary | ICD-10-CM

## 2012-04-22 DIAGNOSIS — F988 Other specified behavioral and emotional disorders with onset usually occurring in childhood and adolescence: Secondary | ICD-10-CM

## 2012-04-22 MED ORDER — LISDEXAMFETAMINE DIMESYLATE 50 MG PO CAPS: 50.0000 mg | ORAL_CAPSULE | ORAL | Status: DC

## 2012-04-22 NOTE — Assessment & Plan Note (Signed)
Problem stable.  Continue current medications and diet appropriate for this condition.  We have reviewed our general long term plan for this problem and also reviewed symptoms and signs that should prompt the patient to call or return to the office.  
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
## 2012-04-22 NOTE — Assessment & Plan Note (Signed)
Problem stable.  Continue current medications and diet appropriate for this condition.  We have reviewed our general long term plan for this problem and also reviewed symptoms and signs that should prompt the patient to call or return to the office. Rx's given today for this month's vyvanse, plus a rx for each of the next 3 following months, all with appropriate "fill on or after" dates.

## 2012-06-17 ENCOUNTER — Ambulatory Visit (INDEPENDENT_AMBULATORY_CARE_PROVIDER_SITE_OTHER): Payer: BC Managed Care – PPO | Admitting: Family Medicine

## 2012-06-17 ENCOUNTER — Encounter: Payer: Self-pay | Admitting: Family Medicine

## 2012-06-17 VITALS — BP 141/64 | HR 96 | Temp 97.4°F | Ht 72.0 in | Wt 268.0 lb

## 2012-06-17 DIAGNOSIS — L0390 Cellulitis, unspecified: Secondary | ICD-10-CM | POA: Insufficient documentation

## 2012-06-17 DIAGNOSIS — L0291 Cutaneous abscess, unspecified: Secondary | ICD-10-CM | POA: Insufficient documentation

## 2012-06-17 MED ORDER — HYDROCODONE-ACETAMINOPHEN 5-325 MG PO TABS: 1.0000 | ORAL_TABLET | Freq: Four times a day (QID) | ORAL | Status: AC | PRN

## 2012-06-17 MED ORDER — SULFAMETHOXAZOLE-TMP DS 800-160 MG PO TABS: 1.0000 | ORAL_TABLET | Freq: Two times a day (BID) | ORAL | Status: DC

## 2012-06-17 MED ORDER — MUPIROCIN 2 % EX OINT: TOPICAL_OINTMENT | Freq: Three times a day (TID) | CUTANEOUS | Status: DC

## 2012-06-17 NOTE — Assessment & Plan Note (Signed)
At this point his abscess is tiny. Cellulitic portion of this is the primary issue.  Bactrim DS 1 bid x 10d. Bactroban ointment tid to affected area when it opens up and drains. Continue aggressive heat applications. F/u 2 d if not improved, earlier if worse.

## 2012-06-17 NOTE — Patient Instructions (Signed)
Take ibuprofen 600mg  with food 2-3 times per day. Use head (dry or wet) 20 min several times a day. Take oral antibiotics. F/u 2 days if not improving

## 2012-06-17 NOTE — Progress Notes (Signed)
OFFICE NOTE  06/17/2012  CC:  Chief Complaint  Patient presents with  . Acute    boil on right inner thigh x 6 days; not draining     HPI:   Patient is a 22 y.o. Caucasian male who is here for right inner thigh swollen spot. Onset about 6 d/a, looked like a pimple--squeezed on it, no successful result results, no drainage.  Very painful but no fatigue or distress.  Has had a small area on chest similar in past that spont resolved but has returned a  Bit lately.  Applied dry heat off and on the last 2d. He has never had a boild that required I&D, also has never had cellulitis. Two aquaintances in the last few weeks have had similar lesions that had to get I&D.   Pertinent PMH:  Past Medical History  Diagnosis Date  . Asthma     Childhood; quiescent since age 60  . ADHD (attention deficit hyperactivity disorder)     treated successfully in middle and HS with adderall; has done well with switch over to vyvanse  . GAD (generalized anxiety disorder)     Mixed sx's (anxiety/panic/irritability/depression) in the past resulted in trials of several SSRIs, which apparently made him more irritable.  Trials of seroquel and abilify resulted in excessive wt gain and didn't help much.  Buspar was oversedating.  Citalopram 40mg  qd x 32mo NO HELP (2012)--ultimately decided to stick with clonaz bid and this has been effective.  . Migraine syndrome     MEDS;   Outpatient Prescriptions Prior to Visit  Medication Sig Dispense Refill  . acetaminophen (TYLENOL) 500 MG tablet Take 1,000 mg by mouth as needed.        Marland Kitchen albuterol (VENTOLIN HFA) 108 (90 BASE) MCG/ACT inhaler Inhale 2 puffs into the lungs every 4 (four) hours as needed for wheezing.  1 Inhaler  1  . clonazePAM (KLONOPIN) 2 MG tablet Take 1 tablet (2 mg total) by mouth 2 (two) times daily.  60 tablet  5  . lisdexamfetamine (VYVANSE) 50 MG capsule Take 1 capsule (50 mg total) by mouth every morning.  30 capsule  0  .  Pseudoeph-Chlorphen-Hydrocod (ZUTRIPRO) 60-4-5 MG/5ML SOLN Take 5 mLs by mouth every 6 (six) hours as needed.  120 mL  0    PE: Blood pressure 141/64, pulse 96, temperature 97.4 F (36.3 C), temperature source Temporal, height 6' (1.829 m), weight 268 lb (121.564 kg). Gen: Alert, well appearing.  Patient is oriented to person, place, time, and situation. Central chest with 1 cm pink papular lesion without tenderness or streaking or surrounding erythema. Right inner thigh with 1 cm mildly indurated oval violacious spot with no significant palpable subQ nodule or fluctuance.  He has an irregular shaped area of erythematous and tender skin around the central lesion, fairly distinct borders (7 cm vertical axis, 6 cm horizontal axis).  IMPRESSION AND PLAN:  Cellulitis and abscess At this point his abscess is tiny. Cellulitic portion of this is the primary issue.  Bactrim DS 1 bid x 10d. Bactroban ointment tid to affected area when it opens up and drains. Continue aggressive heat applications. F/u 2 d if not improved, earlier if worse.    FOLLOW UP:  No Follow-up on file.

## 2012-07-03 ENCOUNTER — Telehealth: Payer: Self-pay | Admitting: *Deleted

## 2012-07-03 ENCOUNTER — Other Ambulatory Visit: Payer: Self-pay | Admitting: Family Medicine

## 2012-07-03 MED ORDER — MUPIROCIN 2 % EX OINT: TOPICAL_OINTMENT | CUTANEOUS | Status: DC

## 2012-07-03 MED ORDER — SULFAMETHOXAZOLE-TMP DS 800-160 MG PO TABS: 1.0000 | ORAL_TABLET | Freq: Two times a day (BID) | ORAL | Status: AC

## 2012-07-03 NOTE — Telephone Encounter (Signed)
Pt called stating he has another cellulitis on right inner thigh.  Sore is near previously treated area.  Area is open and draining.  Pt states he did not finish his antibiotics.  He has taken all of his hydrocodone and would like more. Tagg would like to know if he can have another round of antibiotics and more pain meds.  I have told pt that if he gets more antibiotics he needs to take all of them.  Pt is agreeable. Please advise if abx OK, or does pt need OV to eval?

## 2012-07-03 NOTE — Telephone Encounter (Signed)
Pt notified.  Advised to take tylenol or motrin for pain.  Will need OV for anyhting stronger.  RX's sent to pharmacy by Dr. Milinda Cave.

## 2012-07-03 NOTE — Telephone Encounter (Signed)
Sorry, no pain meds. I will do antibiotic rx BUT pt needs to continue frequent application of heat (dry or moist, either is fine) and if the area is not fully draining or if he begins to get fevers or feel flu-like symptoms then he needs to come in for check in the office.

## 2012-09-12 ENCOUNTER — Encounter: Payer: Self-pay | Admitting: Family Medicine

## 2012-09-12 ENCOUNTER — Ambulatory Visit (INDEPENDENT_AMBULATORY_CARE_PROVIDER_SITE_OTHER): Payer: BC Managed Care – PPO | Admitting: Family Medicine

## 2012-09-12 VITALS — BP 135/75 | HR 74 | Temp 98.2°F | Ht 72.0 in | Wt 253.8 lb

## 2012-09-12 DIAGNOSIS — F3130 Bipolar disorder, current episode depressed, mild or moderate severity, unspecified: Secondary | ICD-10-CM

## 2012-09-12 MED ORDER — LISDEXAMFETAMINE DIMESYLATE 50 MG PO CAPS: 50.0000 mg | ORAL_CAPSULE | ORAL | Status: DC
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
## 2012-09-12 NOTE — Assessment & Plan Note (Signed)
Offered to start additional med today and pt declined. I recommended pt contact [facility] for acute outpatient (intensive) therapy evaluation and he declined. He repeatedly said "I just know I can't follow through with that".  As stated in HPI, he denied feeling suicidal or homicidal thoughts. He requested his vyvanse and clonazepam RFs today: his Marylouise Stacks was last done at the end of June with 5 additional RFs so he should not need new rx today.  However, it appears he is due for his vyvanse rx so I gave 30d supply today, no RFs.  I asked him to return in 30d for f/u in office. He remarked that he is "probably moving".

## 2012-09-29 ENCOUNTER — Other Ambulatory Visit: Payer: Self-pay | Admitting: Family Medicine

## 2012-09-30 ENCOUNTER — Other Ambulatory Visit: Payer: Self-pay | Admitting: Family Medicine

## 2012-09-30 NOTE — Telephone Encounter (Signed)
I spoke with Kenyen and he is going out of town today.  He states he has six pills left.  Advised RX was written on 6/25 with 5 refills and he should not need refills until about that time every month.  Pt states he calls it in a couple of days early and the pharmacy fills it.  Advised that's OK, but he should have enough meds to last him until close to the 25th of each month.  Pt states he has not had any leftover meds.  He states his last fill was on 11/12 and he only has 6 pills left.   I spoke with pharmacy and pt had RX filled on 6/26, 7/22, 8/19, 9/16, 10/13, and 11/12.   Advised pt I would do the math, but he is two weeks early.  Advised we would call him back later today.  Pt states he will be leaving 5 or 6 today.

## 2012-09-30 NOTE — Telephone Encounter (Signed)
Patient is going out of town today. Can he get his refill today?

## 2012-09-30 NOTE — Telephone Encounter (Signed)
Advised per Dr. Milinda Cave that refill is too early.  We gave him RX for 180 days worth of meds on 04/16/12 and he will not be due for refill until 10/12/12.  Advised he can call for refill on 10/10/12.  Pt asks if he is supposed to go 1.5 weeks without meds and I advised unfortunately that will be the case.  Pt states his bottle says he can refill on 12/12.  Explained to pt that his last refill per pharmacy was on 11/12 and 30 days past that date would be 12/12, but that is too early considering the number of days worth of medication he was originally given.  Pt states he was just here and told Dr. Milinda Cave that he would need refill soon.  Pt was not given RX at that time as he was not due.  Pt states that going 1 1/2 weeks without meidcaiton was not going to work well for him.  Apologized that there was nothing we could do as we were not giving early refills.   Pt wants to know what he has to do to get his "papers" from this office so he can find a new doctor.  Advised he will just need to sign a records release and we can send his records to the doctor of his choice. Pt states "alright then" and  hung up phone.

## 2012-09-30 NOTE — Telephone Encounter (Signed)
I declined to RF this b/c it is too early.

## 2012-10-01 ENCOUNTER — Telehealth: Payer: Self-pay | Admitting: Family Medicine

## 2012-10-01 ENCOUNTER — Other Ambulatory Visit: Payer: Self-pay | Admitting: Family Medicine

## 2012-10-01 NOTE — Telephone Encounter (Signed)
Patient showed up in office saying he "didn't understand" why he doesn't have enough medication & that he only has 4 pills left at his house. The patient was advised that Dr. Milinda Cave is not in the office this afternoon & was advised he could come back in the morning to see Dr. Milinda Cave, patient declined. Patient requested his records & that he was finding another doctor. Patient was given a copy of OV notes from 12/11/11 to 09/12/12.

## 2012-10-01 NOTE — Telephone Encounter (Signed)
NotedJudeth Cornfield, FYI.

## 2012-10-24 ENCOUNTER — Ambulatory Visit: Payer: BC Managed Care – PPO | Admitting: Family Medicine

## 2012-12-18 ENCOUNTER — Ambulatory Visit (HOSPITAL_COMMUNITY): Payer: BC Managed Care – PPO | Admitting: Psychiatry

## 2012-12-20 ENCOUNTER — Ambulatory Visit (HOSPITAL_COMMUNITY): Payer: BC Managed Care – PPO | Admitting: Psychiatry

## 2013-01-10 ENCOUNTER — Encounter (HOSPITAL_COMMUNITY): Payer: Self-pay | Admitting: Psychiatry

## 2013-01-10 ENCOUNTER — Ambulatory Visit (INDEPENDENT_AMBULATORY_CARE_PROVIDER_SITE_OTHER): Payer: BC Managed Care – PPO | Admitting: Psychiatry

## 2013-01-10 VITALS — Wt 269.4 lb

## 2013-01-10 DIAGNOSIS — F1010 Alcohol abuse, uncomplicated: Secondary | ICD-10-CM

## 2013-01-10 DIAGNOSIS — F39 Unspecified mood [affective] disorder: Secondary | ICD-10-CM

## 2013-01-10 DIAGNOSIS — F0630 Mood disorder due to known physiological condition, unspecified: Secondary | ICD-10-CM
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
## 2013-02-06 ENCOUNTER — Ambulatory Visit: Payer: Self-pay | Admitting: Family Medicine

## 2013-02-12 ENCOUNTER — Encounter: Payer: Self-pay | Admitting: Family Medicine

## 2013-02-12 ENCOUNTER — Ambulatory Visit (INDEPENDENT_AMBULATORY_CARE_PROVIDER_SITE_OTHER): Payer: BC Managed Care – PPO | Admitting: Family Medicine

## 2013-02-12 VITALS — BP 135/64 | HR 76 | Temp 98.1°F | Ht 72.0 in | Wt 273.0 lb

## 2013-02-12 DIAGNOSIS — F988 Other specified behavioral and emotional disorders with onset usually occurring in childhood and adolescence: Secondary | ICD-10-CM

## 2013-02-12 DIAGNOSIS — F3130 Bipolar disorder, current episode depressed, mild or moderate severity, unspecified: Secondary | ICD-10-CM

## 2013-02-12 DIAGNOSIS — F319 Bipolar disorder, unspecified: Secondary | ICD-10-CM

## 2013-02-12 MED ORDER — CLONAZEPAM 2 MG PO TABS: 2.0000 mg | ORAL_TABLET | Freq: Two times a day (BID) | ORAL | Status: DC

## 2013-02-12 MED ORDER — LISDEXAMFETAMINE DIMESYLATE 50 MG PO CAPS: 50.0000 mg | ORAL_CAPSULE | ORAL | Status: DC

## 2013-02-12 NOTE — Patient Instructions (Addendum)
Labs prior to visit lipid, renal, cbc, tsh, hepatic   Preventive Care for Adults, Male A healthy lifestyle and preventive care can promote health and wellness. Preventive health guidelines for men include the following key practices:  A routine yearly physical is a good way to check with your caregiver about your health and preventative screening. It is a chance to share any concerns and updates on your health, and to receive a thorough exam.  Visit your dentist for a routine exam and preventative care every 6 months. Brush your teeth twice a day and floss once a day. Good oral hygiene prevents tooth decay and gum disease.  The frequency of eye exams is based on your age, health, family medical history, use of contact lenses, and other factors. Follow your caregiver's recommendations for frequency of eye exams.  Eat a healthy diet. Foods like vegetables, fruits, whole grains, low-fat dairy products, and lean protein foods contain the nutrients you need without too many calories. Decrease your intake of foods high in solid fats, added sugars, and salt. Eat the right amount of calories for you.Get information about a proper diet from your caregiver, if necessary.  Regular physical exercise is one of the most important things you can do for your health. Most adults should get at least 150 minutes of moderate-intensity exercise (any activity that increases your heart rate and causes you to sweat) each week. In addition, most adults need muscle-strengthening exercises on 2 or more days a week.  Maintain a healthy weight. The body mass index (BMI) is a screening tool to identify possible weight problems. It provides an estimate of body fat based on height and weight. Your caregiver can help determine your BMI, and can help you achieve or maintain a healthy weight.For adults 20 years and older:  A BMI below 18.5 is considered underweight.  A BMI of 18.5 to 24.9 is normal.  A BMI of 25 to 29.9 is  considered overweight.  A BMI of 30 and above is considered obese.  Maintain normal blood lipids and cholesterol levels by exercising and minimizing your intake of saturated fat. Eat a balanced diet with plenty of fruit and vegetables. Blood tests for lipids and cholesterol should begin at age 40 and be repeated every 5 years. If your lipid or cholesterol levels are high, you are over 50, or you are a high risk for heart disease, you may need your cholesterol levels checked more frequently.Ongoing high lipid and cholesterol levels should be treated with medicines if diet and exercise are not effective.  If you smoke, find out from your caregiver how to quit. If you do not use tobacco, do not start.  If you choose to drink alcohol, do not exceed 2 drinks per day. One drink is considered to be 12 ounces (355 mL) of beer, 5 ounces (148 mL) of wine, or 1.5 ounces (44 mL) of liquor.  Avoid use of street drugs. Do not share needles with anyone. Ask for help if you need support or instructions about stopping the use of drugs.  High blood pressure causes heart disease and increases the risk of stroke. Your blood pressure should be checked at least every 1 to 2 years. Ongoing high blood pressure should be treated with medicines, if weight loss and exercise are not effective.  If you are 33 to 23 years old, ask your caregiver if you should take aspirin to prevent heart disease.  Diabetes screening involves taking a blood sample to check your fasting  blood sugar level. This should be done once every 3 years, after age 48, if you are within normal weight and without risk factors for diabetes. Testing should be considered at a younger age or be carried out more frequently if you are overweight and have at least 1 risk factor for diabetes.  Colorectal cancer can be detected and often prevented. Most routine colorectal cancer screening begins at the age of 39 and continues through age 30. However, your caregiver  may recommend screening at an earlier age if you have risk factors for colon cancer. On a yearly basis, your caregiver may provide home test kits to check for hidden blood in the stool. Use of a small camera at the end of a tube, to directly examine the colon (sigmoidoscopy or colonoscopy), can detect the earliest forms of colorectal cancer. Talk to your caregiver about this at age 3, when routine screening begins. Direct examination of the colon should be repeated every 5 to 10 years through age 79, unless early forms of pre-cancerous polyps or small growths are found.  Hepatitis C blood testing is recommended for all people born from [birth year] through 1965 and any individual with known risks for hepatitis C.  Practice safe sex. Use condoms and avoid high-risk sexual practices to reduce the spread of sexually transmitted infections (STIs). STIs include gonorrhea, chlamydia, syphilis, trichomonas, herpes, HPV, and human immunodeficiency virus (HIV). Herpes, HIV, and HPV are viral illnesses that have no cure. They can result in disability, cancer, and death.  A one-time screening for abdominal aortic aneurysm (AAA) and surgical repair of large AAAs by sound wave imaging (ultrasonography) is recommended for ages 40 to 10 years who are current or former smokers.  Healthy men should no longer receive prostate-specific antigen (PSA) blood tests as part of routine cancer screening. Consult with your caregiver about prostate cancer screening.  Testicular cancer screening is not recommended for adult males who have no symptoms. Screening includes self-exam, caregiver exam, and other screening tests. Consult with your caregiver about any symptoms you have or any concerns you have about testicular cancer.  Use sunscreen with skin protection factor (SPF) of 30 or more. Apply sunscreen liberally and repeatedly throughout the day. You should seek shade when your shadow is shorter than you. Protect yourself by wearing  long sleeves, pants, a wide-brimmed hat, and sunglasses year round, whenever you are outdoors.  Once a month, do a whole body skin exam, using a mirror to look at the skin on your back. Notify your caregiver of new moles, moles that have irregular borders, moles that are larger than a pencil eraser, or moles that have changed in shape or color.  Stay current with required immunizations.  Influenza. You need a dose every fall (or winter). The composition of the flu vaccine changes each year, so being vaccinated once is not enough.  Pneumococcal polysaccharide. You need 1 to 2 doses if you smoke cigarettes or if you have certain chronic medical conditions. You need 1 dose at age 52 (or older) if you have never been vaccinated.  Tetanus, diphtheria, pertussis (Tdap, Td). Get 1 dose of Tdap vaccine if you are younger than age 21 years, are over 53 and have contact with an infant, are a Research scientist (physical sciences), or simply want to be protected from whooping cough. After that, you need a Td booster dose every 10 years. Consult your caregiver if you have not had at least 3 tetanus and diphtheria-containing shots sometime in your life or have  a deep or dirty wound.  HPV. This vaccine is recommended for males 13 through 22 years of age. This vaccine may be given to men 22 through 23 years of age who have not completed the 3 dose series. It is recommended for men through age 16 who have sex with men or whose immune system is weakened because of HIV infection, other illness, or medications. The vaccine is given in 3 doses over 6 months.  Measles, mumps, rubella (MMR). You need at least 1 dose of MMR if you were born in 1957 or later. You may also need a 2nd dose.  Meningococcal. If you are age 48 to 77 years and a Orthoptist living in a residence hall, or have one of several medical conditions, you need to get vaccinated against meningococcal disease. You may also need additional booster  doses.  Zoster (shingles). If you are age 32 years or older, you should get this vaccine.  Varicella (chickenpox). If you have never had chickenpox or you were vaccinated but received only 1 dose, talk to your caregiver to find out if you need this vaccine.  Hepatitis A. You need this vaccine if you have a specific risk factor for hepatitis A virus infection, or you simply wish to be protected from this disease. The vaccine is usually given as 2 doses, 6 to 18 months apart.  Hepatitis B. You need this vaccine if you have a specific risk factor for hepatitis B virus infection or you simply wish to be protected from this disease. The vaccine is given in 3 doses, usually over 6 months. Preventative Service / Frequency Ages 3 to 65  Blood pressure check.** / Every 1 to 2 years.  Lipid and cholesterol check.** / Every 5 years beginning at age 57.  Hepatitis C blood test.** / For any individual with known risks for hepatitis C.  Skin self-exam. / Monthly.  Influenza immunization.** / Every year.  Pneumococcal polysaccharide immunization.** / 1 to 2 doses if you smoke cigarettes or if you have certain chronic medical conditions.  Tetanus, diphtheria, pertussis (Tdap,Td) immunization. / A one-time dose of Tdap vaccine. After that, you need a Td booster dose every 10 years.  HPV immunization. / 3 doses over 6 months, if 26 and younger.  Measles, mumps, rubella (MMR) immunization. / You need at least 1 dose of MMR if you were born in 1957 or later. You may also need a 2nd dose.  Meningococcal immunization. / 1 dose if you are age 49 to 43 years and a Orthoptist living in a residence hall, or have one of several medical conditions, you need to get vaccinated against meningococcal disease. You may also need additional booster doses.  Varicella immunization.** / Consult your caregiver.  Hepatitis A immunization.** / Consult your caregiver. 2 doses, 6 to 18 months  apart.  Hepatitis B immunization.** / Consult your caregiver. 3 doses usually over 6 months. Ages 55 to 75  Blood pressure check.** / Every 1 to 2 years.  Lipid and cholesterol check.** / Every 5 years beginning at age 59.  Fecal occult blood test (FOBT) of stool. / Every year beginning at age 54 and continuing until age 101. You may not have to do this test if you get colonoscopy every 10 years.  Flexible sigmoidoscopy** or colonoscopy.** / Every 5 years for a flexible sigmoidoscopy or every 10 years for a colonoscopy beginning at age 7 and continuing until age 100.  Hepatitis C blood test.** /  For all people born from [birth year] through 1965 and any individual with known risks for hepatitis C.  Skin self-exam. / Monthly.  Influenza immunization.** / Every year.  Pneumococcal polysaccharide immunization.** / 1 to 2 doses if you smoke cigarettes or if you have certain chronic medical conditions.  Tetanus, diphtheria, pertussis (Tdap/Td) immunization.** / A one-time dose of Tdap vaccine. After that, you need a Td booster dose every 10 years.  Measles, mumps, rubella (MMR) immunization. / You need at least 1 dose of MMR if you were born in 1957 or later. You may also need a 2nd dose.  Varicella immunization.**/ Consult your caregiver.  Meningococcal immunization.** / Consult your caregiver.  Hepatitis A immunization.** / Consult your caregiver. 2 doses, 6 to 18 months apart.  Hepatitis B immunization.** / Consult your caregiver. 3 doses, usually over 6 months. Ages 50 and over  Blood pressure check.** / Every 1 to 2 years.  Lipid and cholesterol check.**/ Every 5 years beginning at age 7.  Fecal occult blood test (FOBT) of stool. / Every year beginning at age 10 and continuing until age 56. You may not have to do this test if you get colonoscopy every 10 years.  Flexible sigmoidoscopy** or colonoscopy.** / Every 5 years for a flexible sigmoidoscopy or every 10 years for a colonoscopy  beginning at age 21 and continuing until age 29.  Hepatitis C blood test.** / For all people born from [birth year] through 1966 and any individual with known risks for hepatitis C.  Abdominal aortic aneurysm (AAA) screening.** / A one-time screening for ages 71 to 3 years who are current or former smokers.  Skin self-exam. / Monthly.  Influenza immunization.** / Every year.  Pneumococcal polysaccharide immunization.** / 1 dose at age 84 (or older) if you have never been vaccinated.  Tetanus, diphtheria, pertussis (Tdap, Td) immunization. / A one-time dose of Tdap vaccine if you are over 65 and have contact with an infant, are a Research scientist (physical sciences), or simply want to be protected from whooping cough. After that, you need a Td booster dose every 10 years.  Varicella immunization. ** / Consult your caregiver.  Meningococcal immunization.** / Consult your caregiver.  Hepatitis A immunization. ** / Consult your caregiver. 2 doses, 6 to 18 months apart.  Hepatitis B immunization.** / Check with your caregiver. 3 doses, usually over 6 months. **Family history and personal history of risk and conditions may change your caregiver's recommendations. Document Released: 12/05/2001 Document Revised: 01/01/2012 Document Reviewed: 03/06/2011 [facility] Patient Information 2013 [place], [place].
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
## 2013-02-15 NOTE — Assessment & Plan Note (Addendum)
Generally does well on Clonopin prn.

## 2013-02-15 NOTE — Assessment & Plan Note (Signed)
Given refill on Vyvvanse, he reports it works well. Is transferring his care to our office.

## 2013-03-10 ENCOUNTER — Telehealth: Payer: Self-pay

## 2013-03-10 ENCOUNTER — Other Ambulatory Visit: Payer: Self-pay

## 2013-03-10 DIAGNOSIS — F319 Bipolar disorder, unspecified: Secondary | ICD-10-CM

## 2013-03-10 DIAGNOSIS — F988 Other specified behavioral and emotional disorders with onset usually occurring in childhood and adolescence: Secondary | ICD-10-CM

## 2013-03-10 MED ORDER — LISDEXAMFETAMINE DIMESYLATE 50 MG PO CAPS: 50.0000 mg | ORAL_CAPSULE | ORAL | Status: DC

## 2013-03-10 NOTE — Telephone Encounter (Signed)
Pt informed RX is ready to be picked up

## 2013-03-10 NOTE — Telephone Encounter (Signed)
Left a message for pt to return my call on home answering machine. Cell phone doesn't have vm set up. RX put in front cabinet

## 2013-03-10 NOTE — Telephone Encounter (Signed)
Please advise? Last RX was done on 02-12-13 quantity 30 with 0 refills

## 2013-03-20 ENCOUNTER — Ambulatory Visit (INDEPENDENT_AMBULATORY_CARE_PROVIDER_SITE_OTHER): Payer: BC Managed Care – PPO | Admitting: Family Medicine

## 2013-03-20 ENCOUNTER — Telehealth: Payer: Self-pay

## 2013-03-20 ENCOUNTER — Encounter: Payer: Self-pay | Admitting: Family Medicine

## 2013-03-20 VITALS — BP 114/80 | HR 67 | Temp 97.8°F | Ht 72.0 in | Wt 254.1 lb

## 2013-03-20 DIAGNOSIS — E785 Hyperlipidemia, unspecified: Secondary | ICD-10-CM

## 2013-03-20 DIAGNOSIS — F988 Other specified behavioral and emotional disorders with onset usually occurring in childhood and adolescence: Secondary | ICD-10-CM

## 2013-03-20 DIAGNOSIS — Z0000 Encounter for general adult medical examination without abnormal findings: Secondary | ICD-10-CM

## 2013-03-20 DIAGNOSIS — R0609 Other forms of dyspnea: Secondary | ICD-10-CM

## 2013-03-20 DIAGNOSIS — F17200 Nicotine dependence, unspecified, uncomplicated: Secondary | ICD-10-CM

## 2013-03-20 DIAGNOSIS — R0683 Snoring: Secondary | ICD-10-CM

## 2013-03-20 DIAGNOSIS — F3130 Bipolar disorder, current episode depressed, mild or moderate severity, unspecified: Secondary | ICD-10-CM

## 2013-03-20 DIAGNOSIS — G2581 Restless legs syndrome: Secondary | ICD-10-CM

## 2013-03-20 DIAGNOSIS — F319 Bipolar disorder, unspecified: Secondary | ICD-10-CM

## 2013-03-20 DIAGNOSIS — R5381 Other malaise: Secondary | ICD-10-CM

## 2013-03-20 LAB — RENAL FUNCTION PANEL
Albumin: 5.2 g/dL (ref 3.5–5.2)
Calcium: 10.7 mg/dL — ABNORMAL HIGH (ref 8.4–10.5)
Glucose, Bld: 90 mg/dL (ref 70–99)
Phosphorus: 3.3 mg/dL (ref 2.3–4.6)
Sodium: 137 mEq/L (ref 135–145)

## 2013-03-20 LAB — HEPATIC FUNCTION PANEL
ALT: 43 U/L (ref 0–53)
AST: 37 U/L (ref 0–37)
Albumin: 5.2 g/dL (ref 3.5–5.2)
Total Bilirubin: 1 mg/dL (ref 0.3–1.2)

## 2013-03-20 LAB — LIPID PANEL
Cholesterol: 219 mg/dL — ABNORMAL HIGH (ref 0–200)
Total CHOL/HDL Ratio: 3 Ratio
Triglycerides: 111 mg/dL (ref <150)

## 2013-03-20 MED ORDER — LISDEXAMFETAMINE DIMESYLATE 50 MG PO CAPS: 50.0000 mg | ORAL_CAPSULE | ORAL | Status: DC

## 2013-03-20 MED ORDER — ROPINIROLE HCL 0.5 MG PO TABS: 0.5000 mg | ORAL_TABLET | Freq: Every evening | ORAL | Status: DC | PRN

## 2013-03-20 MED ORDER — CLONAZEPAM 2 MG PO TABS: 2.0000 mg | ORAL_TABLET | Freq: Two times a day (BID) | ORAL | Status: DC

## 2013-03-20 NOTE — Telephone Encounter (Signed)
pts grandmother called and left a message asking if she needed to call and schedule an ENT appt or our office would. I informed pts grandmother that our office will set this up and give pt a call with all the information. Pts grandma voiced understanding
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
## 2013-03-20 NOTE — Patient Instructions (Addendum)

## 2013-03-23 ENCOUNTER — Encounter: Payer: Self-pay | Admitting: Family Medicine

## 2013-03-23 DIAGNOSIS — E785 Hyperlipidemia, unspecified: Secondary | ICD-10-CM

## 2013-03-23 DIAGNOSIS — R5381 Other malaise: Secondary | ICD-10-CM

## 2013-03-23 HISTORY — DX: Hypercalcemia: E83.52

## 2013-03-23 HISTORY — DX: Morbid (severe) obesity due to excess calories: E66.01

## 2013-03-23 HISTORY — DX: Other malaise: R53.81

## 2013-03-23 HISTORY — DX: Hyperlipidemia, unspecified: E78.5

## 2013-03-23 NOTE — Assessment & Plan Note (Signed)
Minimize calcium in diet and recheck level with PTH in 3-4 weeks

## 2013-03-23 NOTE — Assessment & Plan Note (Signed)
Feels he is doing well on prn use of Clonazepam

## 2013-03-23 NOTE — Assessment & Plan Note (Signed)
Encouraged DASH diet and increased exercise. Has done a good job with weight loss since his last visit.

## 2013-03-23 NOTE — Assessment & Plan Note (Signed)
Avoid trans fats, increase exercise and add krill oil caps

## 2013-03-23 NOTE — Assessment & Plan Note (Signed)
1/2 ppd every 3-4 days, encouraged cessation

## 2013-03-23 NOTE — Assessment & Plan Note (Signed)
givne refill on Vyvanse today.

## 2013-05-14 ENCOUNTER — Ambulatory Visit: Payer: Self-pay | Admitting: Family Medicine

## 2013-05-19 ENCOUNTER — Other Ambulatory Visit: Payer: Self-pay | Admitting: Family Medicine

## 2013-06-19 ENCOUNTER — Ambulatory Visit (INDEPENDENT_AMBULATORY_CARE_PROVIDER_SITE_OTHER): Payer: BC Managed Care – PPO | Admitting: Family Medicine

## 2013-06-19 ENCOUNTER — Encounter: Payer: Self-pay | Admitting: Family Medicine

## 2013-06-19 VITALS — BP 122/90 | HR 97 | Temp 97.7°F | Ht 72.0 in | Wt 247.1 lb

## 2013-06-19 DIAGNOSIS — E860 Dehydration: Secondary | ICD-10-CM

## 2013-06-19 DIAGNOSIS — E785 Hyperlipidemia, unspecified: Secondary | ICD-10-CM

## 2013-06-19 DIAGNOSIS — F3130 Bipolar disorder, current episode depressed, mild or moderate severity, unspecified: Secondary | ICD-10-CM

## 2013-06-19 DIAGNOSIS — R259 Unspecified abnormal involuntary movements: Secondary | ICD-10-CM

## 2013-06-19 DIAGNOSIS — G2581 Restless legs syndrome: Secondary | ICD-10-CM

## 2013-06-19 DIAGNOSIS — R61 Generalized hyperhidrosis: Secondary | ICD-10-CM

## 2013-06-19 DIAGNOSIS — F319 Bipolar disorder, unspecified: Secondary | ICD-10-CM

## 2013-06-19 DIAGNOSIS — F17200 Nicotine dependence, unspecified, uncomplicated: Secondary | ICD-10-CM

## 2013-06-19 DIAGNOSIS — F988 Other specified behavioral and emotional disorders with onset usually occurring in childhood and adolescence: Secondary | ICD-10-CM

## 2013-06-19 DIAGNOSIS — IMO0001 Reserved for inherently not codable concepts without codable children: Secondary | ICD-10-CM

## 2013-06-19 DIAGNOSIS — I499 Cardiac arrhythmia, unspecified: Secondary | ICD-10-CM

## 2013-06-19 DIAGNOSIS — R251 Tremor, unspecified: Secondary | ICD-10-CM

## 2013-06-19 LAB — GLUCOSE, POCT (MANUAL RESULT ENTRY): POC Glucose: 108 mg/dl — AB (ref 70–99)

## 2013-06-19 MED ORDER — LISDEXAMFETAMINE DIMESYLATE 50 MG PO CAPS: 50.0000 mg | ORAL_CAPSULE | ORAL | Status: DC

## 2013-06-19 MED ORDER — PRAMIPEXOLE DIHYDROCHLORIDE 0.5 MG PO TABS: 1.0000 mg | ORAL_TABLET | Freq: Every evening | ORAL | Status: DC | PRN

## 2013-06-19 MED ORDER — CLONAZEPAM 2 MG PO TABS: 2.0000 mg | ORAL_TABLET | Freq: Two times a day (BID) | ORAL | Status: DC
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
## 2013-06-19 NOTE — Assessment & Plan Note (Signed)
vyvanse refills given today

## 2013-06-19 NOTE — Patient Instructions (Addendum)
Remember on a normal day you need 64 oz of clear fluids, (no caffeine), on a hot day working outside you need to drink more and add at least one bottle of Gatorade. Also no skipping meals and make sure you eat a good source of protein every 3-4 hours.   Dehydration, Adult Dehydration is when you lose more fluids from the body than you take in. Vital organs like the kidneys, brain, and heart cannot function without a proper amount of fluids and salt. Any loss of fluids from the body can cause dehydration.  CAUSES   Vomiting.  Diarrhea.  Excessive sweating.  Excessive urine output.  Fever. SYMPTOMS  Mild dehydration  Thirst.  Dry lips.  Slightly dry mouth. Moderate dehydration  Very dry mouth.  Sunken eyes.  Skin does not bounce back quickly when lightly pinched and released.  Dark urine and decreased urine production.  Decreased tear production.  Headache. Severe dehydration  Very dry mouth.  Extreme thirst.  Rapid, weak pulse (more than 100 beats per minute at rest).  Cold hands and feet.  Not able to sweat in spite of heat and temperature.  Rapid breathing.  Blue lips.  Confusion and lethargy.  Difficulty being awakened.  Minimal urine production.  No tears. DIAGNOSIS  Your caregiver will diagnose dehydration based on your symptoms and your exam. Blood and urine tests will help confirm the diagnosis. The diagnostic evaluation should also identify the cause of dehydration. TREATMENT  Treatment of mild or moderate dehydration can often be done at home by increasing the amount of fluids that you drink. It is best to drink small amounts of fluid more often. Drinking too much at one time can make vomiting worse. Refer to the home care instructions below. Severe dehydration needs to be treated at the hospital where you will probably be given intravenous (IV) fluids that contain water and electrolytes. HOME CARE INSTRUCTIONS   Ask your caregiver about  specific rehydration instructions.  Drink enough fluids to keep your urine clear or pale yellow.  Drink small amounts frequently if you have nausea and vomiting.  Eat as you normally do.  Avoid:  Foods or drinks high in sugar.  Carbonated drinks.  Juice.  Extremely hot or cold fluids.  Drinks with caffeine.  Fatty, greasy foods.  Alcohol.  Tobacco.  Overeating.  Gelatin desserts.  Wash your hands well to avoid spreading bacteria and viruses.  Only take over-the-counter or prescription medicines for pain, discomfort, or fever as directed by your caregiver.  Ask your caregiver if you should continue all prescribed and over-the-counter medicines.  Keep all follow-up appointments with your caregiver. SEEK MEDICAL CARE IF:  You have abdominal pain and it increases or stays in one area (localizes).  You have a rash, stiff neck, or severe headache.  You are irritable, sleepy, or difficult to awaken.  You are weak, dizzy, or extremely thirsty. SEEK IMMEDIATE MEDICAL CARE IF:   You are unable to keep fluids down or you get worse despite treatment.  You have frequent episodes of vomiting or diarrhea.  You have blood or green matter (bile) in your vomit.  You have blood in your stool or your stool looks black and tarry.  You have not urinated in 6 to 8 hours, or you have only urinated a small amount of very dark urine.  You have a fever.  You faint. MAKE SURE YOU:   Understand these instructions.  Will watch your condition.  Will get help right away  if you are not doing well or get worse. Document Released: 10/09/2005 Document Revised: 01/01/2012 Document Reviewed: 05/29/2011 [facility] Patient Information 2014 [place], [place].

## 2013-06-20 LAB — HEPATIC FUNCTION PANEL
Albumin: 5.1 g/dL (ref 3.5–5.2)
Bilirubin, Direct: 0.1 mg/dL (ref 0.0–0.3)
Total Bilirubin: 0.5 mg/dL (ref 0.3–1.2)

## 2013-06-20 LAB — CBC
HCT: 43.4 % (ref 39.0–52.0)
Hemoglobin: 14.9 g/dL (ref 13.0–17.0)
RDW: 13.3 % (ref 11.5–15.5)
WBC: 8.4 10*3/uL (ref 4.0–10.5)

## 2013-06-20 LAB — TSH: TSH: 1.022 u[IU]/mL (ref 0.350–4.500)

## 2013-06-20 LAB — RENAL FUNCTION PANEL
Albumin: 5.1 g/dL (ref 3.5–5.2)
Calcium: 10.1 mg/dL (ref 8.4–10.5)
Sodium: 140 mEq/L (ref 135–145)

## 2013-06-22 ENCOUNTER — Encounter: Payer: Self-pay | Admitting: Family Medicine

## 2013-06-22 DIAGNOSIS — G2581 Restless legs syndrome: Secondary | ICD-10-CM

## 2013-06-22 DIAGNOSIS — E860 Dehydration: Secondary | ICD-10-CM | POA: Insufficient documentation

## 2013-06-22 HISTORY — DX: Restless legs syndrome: G25.81

## 2013-06-22 HISTORY — DX: Dehydration: E86.0

## 2013-06-22 NOTE — Assessment & Plan Note (Signed)
Stable off meds at this time.

## 2013-06-22 NOTE — Assessment & Plan Note (Signed)
Patient feeling shakey and weak in office, EKG normal, clinically dehydrated and has not eaten today. Has been working outside in heat doing very strenuous work. Labs unremarkable. Encouraged gatorade daily when working in heat. Small frequent meals with lean proteins and complex carbs.

## 2013-06-22 NOTE — Assessment & Plan Note (Signed)
May consider Mirapex.

## 2013-06-22 NOTE — Assessment & Plan Note (Signed)
Encouraged complete cessation. 

## 2013-06-22 NOTE — Assessment & Plan Note (Signed)
Encouraged DASH diet, increase exercise as tolerated. 

## 2013-08-12 ENCOUNTER — Ambulatory Visit (INDEPENDENT_AMBULATORY_CARE_PROVIDER_SITE_OTHER): Payer: BC Managed Care – PPO | Admitting: Family Medicine

## 2013-08-12 ENCOUNTER — Encounter: Payer: Self-pay | Admitting: Family Medicine

## 2013-08-12 VITALS — BP 132/80 | HR 70 | Temp 98.0°F | Ht 72.0 in | Wt 262.0 lb

## 2013-08-12 DIAGNOSIS — F329 Major depressive disorder, single episode, unspecified: Secondary | ICD-10-CM

## 2013-08-12 DIAGNOSIS — Z23 Encounter for immunization: Secondary | ICD-10-CM

## 2013-08-12 DIAGNOSIS — F3130 Bipolar disorder, current episode depressed, mild or moderate severity, unspecified: Secondary | ICD-10-CM

## 2013-08-12 MED ORDER — ALPRAZOLAM 1 MG PO TABS: 1.0000 mg | ORAL_TABLET | Freq: Two times a day (BID) | ORAL | Status: DC | PRN

## 2013-08-12 MED ORDER — QUETIAPINE FUMARATE 100 MG PO TABS: ORAL_TABLET | ORAL | Status: DC

## 2013-08-12 NOTE — Patient Instructions (Signed)
Manic Depression (Bipolar Disorder)  Bipolar disorder is also known as manic depressive illness. It is when the brain does not function properly and causes shifts in a person's moods, energy and ability to function in everyday life. These shifts are different from the normal ups and downs that everyone experiences. Instead the shifts are severe. If this goes untreated, the person's life becomes more and more disorderly. People with this disorder can be treated can lead full and productive lives. This disorder must be managed throughout life.   SYMPTOMS    Bipolar disorder causes dramatic mood swings. These mood swings go in cycles. They cycle from extreme "highs" and irritable to deep "lows" of sadness and hopelessness.   Between the extreme moods, there are usually periods of normal mood.   Along with the mood shifts, the person will have severe changes in energy and behavior. The periods of "highs" and "lows" are called episodes of mania and depression.  Signs of mania:   Lots of energy, activity and restlessness.   Extreme "high" or good mood.   Extreme irritability.   Racing thoughts and talking very fast.   Jumping from one idea to another.   Not able to focus, easily distracted.   Little need to sleep.   Grand beliefs in one's abilities and powers.   Spending sprees.   Increased sexual drive. This can result in many sexual partners.   Poor judgment.   Abuse of drugs, particularly cocaine, alcohol, and sleeping medication.   Aggressive or provocative behavior.   A lasting period of behavior that is different from usual.   Denial that anything is wrong.  *A manic episode is identified if a "high" mood happens with three or more of the other symptoms lasting most of the day, nearly everyday for a week or longer. If the mood is more irritable in nature, four additional symptoms must be present.  Signs of depression:   Lasting feelings of sadness, anxiety, or empty mood.   Feelings of  hopelessness with negative thoughts.   Feelings of guilt, worthlessness, or helplessness.   Loss of interest or pleasure in activities once enjoyed, including sex.   Feelings of fatigue or having less energy.   Trouble focusing, making decisions, remembering.   Feeling restless or irritable.   Sleeping too little or too much.   Change in eating with possible weight gain or loss.   Feeling ongoing pain that is not caused by physical illness or injury.   Thoughts of death or suicide or suicide attempts.  *A depressive episode is identified as having five or more of the above symptoms that last most of the day, nearly everyday for two weeks or longer.  CAUSES    Research shows that there is no single cause for the disorder. Many factors act together to produce the illness.   This can be passed down from family (hereditary).   Environment may play a part.  TREATMENT    Long-term treatment is strongly recommended because bipolar disorder is a repeated illness. This disorder is better controlled if treatment is ongoing than if it is off and on.   A combination of medication and talk therapy is best for managing the disorder over time.   Medication.   Medication can be prescribed by a doctor that is an expert in treating mental disorders (psychiatrists). Medications known as "mood stabilizers" are usually prescribed to help control the illness. Other medications can be added when needed. These medicines usually treat episodes   of mania or depression that break through despite the mood stabilizer.   Talk Therapy.   Along with medication, some forms of talk therapy are helpful in providing support, education and guidance to people with the illness and their families. Studies show that this type of treatment increases mood stability, decreases need for hospitalization and improves how they function society.   Electroconvulsive Therapy (ECT).   In extreme situations where the above treatments do not work or  work too slowly to relieve severe symptoms, ECT may be considered.  Document Released: 01/15/2001 Document Revised: 01/01/2012 Document Reviewed: 09/06/2007  [employer] Patient Information 2014 [employer]

## 2013-08-16 ENCOUNTER — Encounter: Payer: Self-pay | Admitting: Family Medicine
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
## 2013-08-16 NOTE — Assessment & Plan Note (Addendum)
Patient very upset today but has is not suicidal or a risk to self or others per patient. Will try Seroquel tittrated up to 300 mg quickly and see him again next week. He agrees to seek care if he worsens. Spent greater than 30 minutes counseling patient.

## 2013-08-21 ENCOUNTER — Ambulatory Visit: Payer: Self-pay | Admitting: Family Medicine

## 2013-09-15 ENCOUNTER — Other Ambulatory Visit: Payer: Self-pay | Admitting: Family Medicine

## 2013-09-15 NOTE — Telephone Encounter (Signed)
Can have 2 weeks worth of Seroquel and Alprazolam but no more til seen

## 2013-09-15 NOTE — Telephone Encounter (Signed)
eScribe request for refill on Alprazolam Last filled - 10.21.14, #30x0 eScribe request for refill on Seroquel Last filled - 10.21.14, #90x0 Last AEX - 10.21.14 Next AEX - 1 Week [Canceled F/U appt 10.30.14] Please Advise/SLS

## 2013-09-16 NOTE — Telephone Encounter (Signed)
Pt informed, RX's sent and appt scheduled for 09-26-13

## 2013-09-22 ENCOUNTER — Ambulatory Visit (INDEPENDENT_AMBULATORY_CARE_PROVIDER_SITE_OTHER): Payer: BC Managed Care – PPO | Admitting: Family Medicine

## 2013-09-22 ENCOUNTER — Encounter: Payer: Self-pay | Admitting: Family Medicine

## 2013-09-22 VITALS — BP 132/98 | HR 100 | Temp 97.7°F | Ht 72.0 in | Wt 254.1 lb

## 2013-09-22 DIAGNOSIS — F411 Generalized anxiety disorder: Secondary | ICD-10-CM

## 2013-09-22 DIAGNOSIS — F3130 Bipolar disorder, current episode depressed, mild or moderate severity, unspecified: Secondary | ICD-10-CM

## 2013-09-22 DIAGNOSIS — F319 Bipolar disorder, unspecified: Secondary | ICD-10-CM

## 2013-09-22 MED ORDER — DIAZEPAM 5 MG PO TABS: 5.0000 mg | ORAL_TABLET | Freq: Two times a day (BID) | ORAL | Status: DC | PRN

## 2013-09-22 MED ORDER — ALPRAZOLAM 1 MG PO TABS: 1.0000 mg | ORAL_TABLET | Freq: Every day | ORAL | Status: DC | PRN

## 2013-09-22 MED ORDER — OLANZAPINE 20 MG PO TABS: 20.0000 mg | ORAL_TABLET | Freq: Every day | ORAL | Status: DC

## 2013-09-22 NOTE — Patient Instructions (Signed)
Bipolar Disorder °Bipolar disorder is a mental illness. The term bipolar disorder actually is used to describe a group of disorders that all share varying degrees of emotional highs and lows that can interfere with daily functioning, such as work, school, or relationships. Bipolar disorder also can lead to drug abuse, hospitalization, and suicide. °The emotional highs of bipolar disorder are periods of elation or irritability and high energy. These highs can range from a mild form (hypomania) to a severe form (mania). People experiencing episodes of hypomania may appear energetic, excitable, and highly productive. People experiencing mania may behave impulsively or erratically. They often make poor decisions. They may have difficulty sleeping. The most severe episodes of mania can involve having very distorted beliefs or perceptions about the world and seeing or hearing things that are not real (psychotic delusions and hallucinations).  °The emotional lows of bipolar disorder (depression) also can range from mild to severe. Severe episodes of bipolar depression can involve psychotic delusions and hallucinations. °Sometimes people with bipolar disorder experience a state of mixed mood. Symptoms of hypomania or mania and depression are both present during this mixed-mood episode. °SIGNS AND SYMPTOMS °There are signs and symptoms of the episodes of hypomania and mania as well as the episodes of depression. The signs and symptoms of hypomania and mania are similar but vary in severity. They include: °· Inflated self-esteem or feeling of increased self-confidence. °· Decreased need for sleep. °· Unusual talkativeness (rapid or pressured speech) or the feeling of a need to keep talking. °· Sensation of racing thoughts or constant talking, with quick shifts between topics that may or may not be related (flight of ideas). °· Decreased ability to focus or concentrate. °· Increased purposeful activity, such as work, studies,  or social activity, or nonproductive activity, such as pacing, squirming and fidgeting, or finger and toe tapping. °· Impulsive behavior and use of poor judgment, resulting in high-risk activities, such as having unprotected sex or spending excessive amounts of money. °Signs and symptoms of depression include the following:  °· Feelings of sadness, hopelessness, or helplessness. °· Frequent or uncontrollable episodes of crying. °· Lack of feeling anything or caring about anything. °· Difficulty sleeping or sleeping too much.  °· Inability to enjoy the things you used to enjoy.   °· Desire to be alone all the time.   °· Feelings of guilt or worthlessness.  °· Lack of energy or motivation.   °· Difficulty concentrating, remembering, or making decisions.  °· Change in appetite or weight beyond normal fluctuations. °· Thoughts of death or the desire to harm yourself. °DIAGNOSIS  °Bipolar disorder is diagnosed through an assessment by your caregiver. Your caregiver will ask questions about your emotional episodes. There are two main types of bipolar disorder. People with type I bipolar disorder have manic episodes with or without depressive episodes. People with type II bipolar disorder have hypomanic episodes and major depressive episodes, which are more serious than mild depression. The type of bipolar disorder you have can make an important difference in how your illness is monitored and treated. °Your caregiver may ask questions about your medical history and use of alcohol or drugs, including prescription medication. Certain medical conditions and substances also can cause emotional highs and lows that resemble bipolar disorder (secondary bipolar disorder).  °TREATMENT  °Bipolar disorder is a long-term illness. It is best controlled with continuous treatment rather than treatment only when symptoms occur. The following treatments can be prescribed for bipolar disorders: °· Medication Medication can be prescribed by  a doctor   that is an expert in treating mental disorders (psychiatrists). Medications called mood stabilizers are usually prescribed to help control the illness. Other medications are sometimes added if symptoms of mania, depression, or psychotic delusions and hallucinations occur despite the use of a mood stabilizer. °· Talk therapy Some forms of talk therapy are helpful in providing support, education, and guidance. °A combination of medication and talk therapy is best for managing the disorder over time. A procedure in which electricity is applied to your brain through your scalp (electroconvulsive therapy) is used in cases of severe mania when medication and talk therapy do not work or work too slowly. °Document Released: 01/15/2001 Document Revised: 02/03/2013 Document Reviewed: 11/04/2012 °[employer]® Patient Information ©2014 [employer]. ° °
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
## 2013-09-22 NOTE — Assessment & Plan Note (Addendum)
Worsened recently with some increased bad dreams and anxiety attacks, worse since starting to work again. Will try switching to Zyprexa 20 mg from Seroquel. Will start Diazepam 5 mg po once to twice daily as a baseline and only use an occasional Alprazolam for a true panic attack. Is now agreeing to a psychiatric referral so that is set up as well and follow up later this week. Spent 25 minutes of a 30 minute office visit in counseling

## 2013-09-22 NOTE — Progress Notes (Signed)
Pre visit review using our clinic review tool, if applicable. No additional management support is needed unless otherwise documented below in the visit note. 

## 2013-09-26 ENCOUNTER — Ambulatory Visit: Payer: Self-pay | Admitting: Family Medicine

## 2013-10-06 ENCOUNTER — Ambulatory Visit: Payer: Self-pay | Admitting: Family Medicine

## 2013-10-21 ENCOUNTER — Other Ambulatory Visit: Payer: Self-pay

## 2013-10-21 DIAGNOSIS — F319 Bipolar disorder, unspecified: Secondary | ICD-10-CM

## 2013-10-21 DIAGNOSIS — F411 Generalized anxiety disorder: Secondary | ICD-10-CM

## 2013-10-21 MED ORDER — ALPRAZOLAM 1 MG PO TABS: 1.0000 mg | ORAL_TABLET | Freq: Every day | ORAL | Status: DC | PRN

## 2013-10-21 MED ORDER — DIAZEPAM 5 MG PO TABS: 5.0000 mg | ORAL_TABLET | Freq: Two times a day (BID) | ORAL | Status: DC | PRN

## 2013-10-21 NOTE — Telephone Encounter (Signed)
RX's put on md's desk to sign  Both RX's were wrote on 09-22-13 so they are both due  Left a message for pt stating these were being sent to [facility]

## 2013-11-10 ENCOUNTER — Ambulatory Visit: Payer: Self-pay | Admitting: Family Medicine

## 2013-11-14 ENCOUNTER — Ambulatory Visit (INDEPENDENT_AMBULATORY_CARE_PROVIDER_SITE_OTHER): Payer: BC Managed Care – PPO | Admitting: Family Medicine

## 2013-11-14 ENCOUNTER — Encounter: Payer: Self-pay | Admitting: Family Medicine

## 2013-11-14 VITALS — BP 142/88 | HR 88 | Temp 97.4°F | Ht 72.0 in | Wt 267.0 lb

## 2013-11-14 DIAGNOSIS — F319 Bipolar disorder, unspecified: Secondary | ICD-10-CM

## 2013-11-14 DIAGNOSIS — F411 Generalized anxiety disorder: Secondary | ICD-10-CM

## 2013-11-14 DIAGNOSIS — F3130 Bipolar disorder, current episode depressed, mild or moderate severity, unspecified: Secondary | ICD-10-CM

## 2013-11-14 MED ORDER — CLONAZEPAM 1 MG PO TABS: 1.0000 mg | ORAL_TABLET | Freq: Three times a day (TID) | ORAL | Status: DC | PRN

## 2013-11-14 MED ORDER — FLUOXETINE HCL 20 MG PO CAPS: 20.0000 mg | ORAL_CAPSULE | Freq: Every day | ORAL | Status: DC

## 2013-11-14 MED ORDER — ALPRAZOLAM 1 MG PO TABS: 1.0000 mg | ORAL_TABLET | Freq: Every day | ORAL | Status: DC | PRN

## 2013-11-14 NOTE — Progress Notes (Signed)
Pre visit review using our clinic review tool, if applicable. No additional management support is needed unless otherwise documented below in the visit note. 

## 2013-11-14 NOTE — Patient Instructions (Signed)
Bipolar Disorder °Bipolar disorder is a mental illness. The term bipolar disorder actually is used to describe a group of disorders that all share varying degrees of emotional highs and lows that can interfere with daily functioning, such as work, school, or relationships. Bipolar disorder also can lead to drug abuse, hospitalization, and suicide. °The emotional highs of bipolar disorder are periods of elation or irritability and high energy. These highs can range from a mild form (hypomania) to a severe form (mania). People experiencing episodes of hypomania may appear energetic, excitable, and highly productive. People experiencing mania may behave impulsively or erratically. They often make poor decisions. They may have difficulty sleeping. The most severe episodes of mania can involve having very distorted beliefs or perceptions about the world and seeing or hearing things that are not real (psychotic delusions and hallucinations).  °The emotional lows of bipolar disorder (depression) also can range from mild to severe. Severe episodes of bipolar depression can involve psychotic delusions and hallucinations. °Sometimes people with bipolar disorder experience a state of mixed mood. Symptoms of hypomania or mania and depression are both present during this mixed-mood episode. °SIGNS AND SYMPTOMS °There are signs and symptoms of the episodes of hypomania and mania as well as the episodes of depression. The signs and symptoms of hypomania and mania are similar but vary in severity. They include: °· Inflated self-esteem or feeling of increased self-confidence. °· Decreased need for sleep. °· Unusual talkativeness (rapid or pressured speech) or the feeling of a need to keep talking. °· Sensation of racing thoughts or constant talking, with quick shifts between topics that may or may not be related (flight of ideas). °· Decreased ability to focus or concentrate. °· Increased purposeful activity, such as work, studies,  or social activity, or nonproductive activity, such as pacing, squirming and fidgeting, or finger and toe tapping. °· Impulsive behavior and use of poor judgment, resulting in high-risk activities, such as having unprotected sex or spending excessive amounts of money. °Signs and symptoms of depression include the following:  °· Feelings of sadness, hopelessness, or helplessness. °· Frequent or uncontrollable episodes of crying. °· Lack of feeling anything or caring about anything. °· Difficulty sleeping or sleeping too much.  °· Inability to enjoy the things you used to enjoy.   °· Desire to be alone all the time.   °· Feelings of guilt or worthlessness.  °· Lack of energy or motivation.   °· Difficulty concentrating, remembering, or making decisions.  °· Change in appetite or weight beyond normal fluctuations. °· Thoughts of death or the desire to harm yourself. °DIAGNOSIS  °Bipolar disorder is diagnosed through an assessment by your caregiver. Your caregiver will ask questions about your emotional episodes. There are two main types of bipolar disorder. People with type I bipolar disorder have manic episodes with or without depressive episodes. People with type II bipolar disorder have hypomanic episodes and major depressive episodes, which are more serious than mild depression. The type of bipolar disorder you have can make an important difference in how your illness is monitored and treated. °Your caregiver may ask questions about your medical history and use of alcohol or drugs, including prescription medication. Certain medical conditions and substances also can cause emotional highs and lows that resemble bipolar disorder (secondary bipolar disorder).  °TREATMENT  °Bipolar disorder is a long-term illness. It is best controlled with continuous treatment rather than treatment only when symptoms occur. The following treatments can be prescribed for bipolar disorders: °· Medication Medication can be prescribed by  a doctor   that is an expert in treating mental disorders (psychiatrists). Medications called mood stabilizers are usually prescribed to help control the illness. Other medications are sometimes added if symptoms of mania, depression, or psychotic delusions and hallucinations occur despite the use of a mood stabilizer. °· Talk therapy Some forms of talk therapy are helpful in providing support, education, and guidance. °A combination of medication and talk therapy is best for managing the disorder over time. A procedure in which electricity is applied to your brain through your scalp (electroconvulsive therapy) is used in cases of severe mania when medication and talk therapy do not work or work too slowly. °Document Released: 01/15/2001 Document Revised: 02/03/2013 Document Reviewed: 11/04/2012 °[employer]® Patient Information ©2014 [employer]. ° °

## 2013-11-17 ENCOUNTER — Telehealth: Payer: Self-pay | Admitting: Family Medicine

## 2013-11-17 ENCOUNTER — Encounter: Payer: Self-pay | Admitting: Family Medicine

## 2013-11-17 DIAGNOSIS — F411 Generalized anxiety disorder: Secondary | ICD-10-CM

## 2013-11-17 DIAGNOSIS — F319 Bipolar disorder, unspecified: Secondary | ICD-10-CM

## 2013-11-17 NOTE — Telephone Encounter (Signed)
Relevant patient education mailed to patient.  

## 2013-11-17 NOTE — Telephone Encounter (Signed)
Patient states that Dr. Abner GreenspanBlyth kept him out of work because of the medication that he was on. He would like Dr. Abner GreenspanBlyth to write him a work note for him to go back to work. He says that he cannot miss anymore work.

## 2013-11-17 NOTE — Telephone Encounter (Signed)
OK to write him a note releasing him to return to work. Just check what day he needs it dated. Check when his psych appt is.

## 2013-11-17 NOTE — Telephone Encounter (Signed)
Refill- olanzapine 20mg  tablet. Take one tablet by mouth at bedtime. Qty 30 last fill 12.27.14
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
## 2013-11-17 NOTE — Assessment & Plan Note (Addendum)
Patient very labile and shaky today. Agrees to urgent referral to psychiatry. Is here today with his grandmother and we will continue current meds and add Fluoxetine. He denies suicidal ideation. 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 Spent 1/2 hour with patient, 25 minutes spent in counseling

## 2013-11-18 MED ORDER — OLANZAPINE 20 MG PO TABS: 20.0000 mg | ORAL_TABLET | Freq: Every day | ORAL | Status: DC

## 2013-11-18 NOTE — Telephone Encounter (Signed)
I will write the letter and pts grandma informed.  pts grandmother also stated she hasn't made his appt yet but she will

## 2013-11-18 NOTE — Telephone Encounter (Signed)
  Last RX was done on 09-22-13 quantity 30 with 1 refill.  RX sent

## 2013-11-19 ENCOUNTER — Telehealth: Payer: Self-pay | Admitting: *Deleted

## 2013-11-19 NOTE — Telephone Encounter (Signed)
Spoke w/ Jason Flores and she is going to call other places to see if she can get him in sooner.

## 2013-11-19 NOTE — Telephone Encounter (Signed)
Pt's mom left message on voicemail that they were contacted about pt's appt with psychiatry and their 1st available isn't until 02/10/14.  They want to know if we can recommend someone else and they do not feel like he can wait this long to be seen.  Please advise.

## 2013-11-19 NOTE — Telephone Encounter (Signed)
Can you please let them know the names and numbers of some other psychiatrists and/or counseling agencies in area so they can call for sooner appt. His grandmother is helping him with this. THX

## 2013-11-30 ENCOUNTER — Emergency Department (HOSPITAL_COMMUNITY): Payer: BC Managed Care – PPO

## 2013-11-30 ENCOUNTER — Emergency Department (HOSPITAL_COMMUNITY)
Admission: EM | Admit: 2013-11-30 | Discharge: 2013-11-30 | Disposition: A | Discharge status: Home / Self Care | Payer: BC Managed Care – PPO | Attending: Emergency Medicine | Admitting: Emergency Medicine

## 2013-11-30 ENCOUNTER — Encounter (HOSPITAL_COMMUNITY): Payer: Self-pay | Admitting: Emergency Medicine

## 2013-11-30 DIAGNOSIS — R000 Tachycardia, unspecified: Secondary | ICD-10-CM | POA: Insufficient documentation

## 2013-11-30 DIAGNOSIS — F319 Bipolar disorder, unspecified: Secondary | ICD-10-CM | POA: Insufficient documentation

## 2013-11-30 DIAGNOSIS — F17200 Nicotine dependence, unspecified, uncomplicated: Secondary | ICD-10-CM | POA: Insufficient documentation

## 2013-11-30 DIAGNOSIS — F419 Anxiety disorder, unspecified: Secondary | ICD-10-CM

## 2013-11-30 DIAGNOSIS — G43909 Migraine, unspecified, not intractable, without status migrainosus: Secondary | ICD-10-CM | POA: Insufficient documentation

## 2013-11-30 DIAGNOSIS — Z79899 Other long term (current) drug therapy: Secondary | ICD-10-CM | POA: Insufficient documentation

## 2013-11-30 DIAGNOSIS — F411 Generalized anxiety disorder: Secondary | ICD-10-CM | POA: Insufficient documentation

## 2013-11-30 DIAGNOSIS — J45901 Unspecified asthma with (acute) exacerbation: Secondary | ICD-10-CM | POA: Insufficient documentation

## 2013-11-30 HISTORY — DX: Bipolar disorder, unspecified: F31.9

## 2013-11-30 HISTORY — DX: Anxiety disorder, unspecified: F41.9

## 2013-11-30 LAB — CBC
HCT: 44.7 % (ref 39.0–52.0)
Hemoglobin: 16.2 g/dL (ref 13.0–17.0)
MCH: 31.6 pg (ref 26.0–34.0)
MCHC: 36.2 g/dL — ABNORMAL HIGH (ref 30.0–36.0)
MCV: 87.3 fL (ref 78.0–100.0)
Platelets: 255 10*3/uL (ref 150–400)
RBC: 5.12 MIL/uL (ref 4.22–5.81)
RDW: 13.5 % (ref 11.5–15.5)
WBC: 7.5 10*3/uL (ref 4.0–10.5)

## 2013-11-30 LAB — BASIC METABOLIC PANEL
BUN: 10 mg/dL (ref 6–23)
CALCIUM: 9.8 mg/dL (ref 8.4–10.5)
CO2: 21 meq/L (ref 19–32)
CREATININE: 0.91 mg/dL (ref 0.50–1.35)
Chloride: 102 mEq/L (ref 96–112)
GFR calc Af Amer: >90 mL/min (ref >90)
GFR calc non Af Amer: >90 mL/min (ref >90)
GLUCOSE: 88 mg/dL (ref 70–99)
Potassium: 4.3 mEq/L (ref 3.7–5.3)
Sodium: 143 mEq/L (ref 137–147)

## 2013-11-30 LAB — POCT I-STAT TROPONIN I: Troponin i, poc: 0.01 ng/mL (ref 0.00–0.08)

## 2013-11-30 MED ORDER — SODIUM CHLORIDE 0.9 % IV BOLUS (SEPSIS)
1000.0000 mL | Freq: Once | INTRAVENOUS | Status: AC
  Administered 2013-11-30: 1000 mL via INTRAVENOUS

## 2013-11-30 MED ORDER — ONDANSETRON HCL 4 MG/2ML IJ SOLN
4.0000 mg | Freq: Once | INTRAMUSCULAR | Status: AC
  Administered 2013-11-30: 4 mg via INTRAVENOUS
  Filled 2013-11-30: qty 2

## 2013-11-30 MED ORDER — LORAZEPAM 2 MG/ML IJ SOLN
1.0000 mg | Freq: Once | INTRAMUSCULAR | Status: AC
  Administered 2013-11-30: 1 mg via INTRAVENOUS
  Filled 2013-11-30: qty 1
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
## 2013-11-30 NOTE — ED Notes (Addendum)
Pt given emesis bag.  Pt has started vomiting.  EDP Allen notified.

## 2013-11-30 NOTE — ED Notes (Signed)
Pt states he has panic attacks and has been started on new medications.  Pt reports that he has bad chest pain since waking this am to left side of chest and non radiating.  Pt reports some heartburn

## 2013-11-30 NOTE — Discharge Instructions (Signed)
Call your doctor tomorrow to discuss medication changes. Take prescriptions as directed until then.   Panic Attacks Panic attacks are sudden, short feelings of great fear or discomfort. You may have them for no reason when you are relaxed, when you are uneasy (anxious), or when you are sleeping.  HOME CARE  Take all your medicines as told.  Check with your doctor before starting new medicines.  Keep all doctor visits. GET HELP IF:  You are not able to take your medicines as told.  Your symptoms do not get better.  Your symptoms get worse. GET HELP RIGHT AWAY IF:  Your attacks seem different than your normal attacks.  You have thoughts about hurting yourself or others.  You take panic attack medicine and you have a side effect. MAKE SURE YOU:  Understand these instructions.  Will watch your condition.  Will get help right away if you are not doing well or get worse. Document Released: 11/11/2010 Document Revised: 07/30/2013 Document Reviewed: 05/23/2013 [facility] Patient Information 2014 [place], [place].

## 2013-11-30 NOTE — ED Notes (Signed)
Pt states that the has had panic attacks and has hx of same. Pt reports starting new medications. States that they have not helped. Pt reports drinking beer and liqor last night and woke with chest pain and pressure and well as tingling in his hands. Pt states that these sx are typical of his panic attacks.

## 2013-12-01 NOTE — ED Provider Notes (Signed)
Medical screening examination/treatment/procedure(s) were conducted as a shared visit with non-physician practitioner(s) and myself.  I personally evaluated the patient during the encounter.  EKG Interpretation    Date/Time:  Sunday November 30 2013 12:55:20 EST Ventricular Rate:  115 PR Interval:  144 QRS Duration: 72 QT Interval:  304 QTC Calculation: 420 R Axis:   9 Text Interpretation:  Sinus tachycardia Confirmed by Keveon Amsler  MD, Rykin Route ([id number]) on 12/01/2013 1:08:40 PM            I interviewed and examined the patient. Lungs are CTAB. Cardiac exam wnl, mildly tachycardic. Abdomen soft.  Labs non-contrib. Low risk per HEART score for MACE. Likely anxiety. Pt feeling better upon re-eval, HR has dec. Will rec d/c home. Pt has f/u w/ psychiatry per parents.   Junius ArgyleForrest S Benancio Osmundson, MD 12/01/13 1310

## 2013-12-11 ENCOUNTER — Telehealth: Payer: Self-pay

## 2013-12-11 ENCOUNTER — Other Ambulatory Visit: Payer: Self-pay | Admitting: Family Medicine

## 2013-12-11 NOTE — Telephone Encounter (Signed)
Please let patient know that Dr Abner GreenspanBlyth sent in one more month, but he needs an appt for a hospital follow up before any more medication will be prescribed.  Thanks

## 2013-12-11 NOTE — Telephone Encounter (Signed)
Left message for patient to return my call.

## 2013-12-11 NOTE — Telephone Encounter (Signed)
Refill request for Xanax Last filled by MD on - 11/14/2013 #30 x0   Refill request for clonazepam Last filled by MD on - 11/14/2013 #90 x0 Last Appt: 11/14/2013 Next Appt: none Please advise refill?

## 2013-12-15 NOTE — Telephone Encounter (Signed)
Informed patient of medication refill and he scheduled appointment for 12/16/13

## 2013-12-15 NOTE — Telephone Encounter (Signed)
Left message for patient to return my call.

## 2013-12-16 ENCOUNTER — Ambulatory Visit: Payer: BC Managed Care – PPO | Admitting: Family Medicine

## 2013-12-17 ENCOUNTER — Ambulatory Visit: Payer: Self-pay | Admitting: Family Medicine

## 2014-01-06 ENCOUNTER — Telehealth: Payer: Self-pay | Admitting: Family Medicine

## 2014-01-06 DIAGNOSIS — F319 Bipolar disorder, unspecified: Secondary | ICD-10-CM

## 2014-01-06 MED ORDER — FLUOXETINE HCL 20 MG PO CAPS: 20.0000 mg | ORAL_CAPSULE | Freq: Every day | ORAL | Status: DC

## 2014-01-06 NOTE — Telephone Encounter (Signed)
Refill fluoxetine.

## 2014-01-09 ENCOUNTER — Ambulatory Visit: Payer: BC Managed Care – PPO | Admitting: Family Medicine

## 2014-05-27 ENCOUNTER — Ambulatory Visit: Payer: Self-pay | Admitting: Family

## 2014-05-27 ENCOUNTER — Telehealth: Payer: Self-pay | Admitting: Family Medicine

## 2014-05-27 NOTE — Telephone Encounter (Signed)
Please call pt to reschedule.

## 2014-05-27 NOTE — Telephone Encounter (Signed)
Scheduled to see Jason Flores for acute body aches  Did not come

## 2014-05-27 NOTE — Telephone Encounter (Signed)
Please do not charge no show
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
## 2014-05-28 ENCOUNTER — Ambulatory Visit: Payer: BC Managed Care – PPO | Admitting: Physician Assistant

## 2014-06-23 ENCOUNTER — Encounter: Payer: Self-pay | Admitting: Physician Assistant

## 2014-06-23 ENCOUNTER — Ambulatory Visit (INDEPENDENT_AMBULATORY_CARE_PROVIDER_SITE_OTHER): Payer: BC Managed Care – PPO | Admitting: Physician Assistant

## 2014-06-23 VITALS — BP 125/85 | HR 96 | Temp 98.0°F | Wt 253.0 lb

## 2014-06-23 DIAGNOSIS — IMO0002 Reserved for concepts with insufficient information to code with codable children: Secondary | ICD-10-CM

## 2014-06-23 DIAGNOSIS — M5416 Radiculopathy, lumbar region: Secondary | ICD-10-CM | POA: Insufficient documentation

## 2014-06-23 MED ORDER — METHYLPREDNISOLONE ACETATE 40 MG/ML IJ SUSP
40.0000 mg | Freq: Once | INTRAMUSCULAR | Status: AC
  Administered 2014-06-23: 40 mg via INTRAMUSCULAR

## 2014-06-23 MED ORDER — METHYLPREDNISOLONE 4 MG PO KIT: PACK | ORAL | Status: DC

## 2014-06-23 NOTE — Addendum Note (Signed)
Addended by: Eustace Quail on: 06/23/2014 10:27 AM   Modules accepted: Orders

## 2014-06-23 NOTE — Progress Notes (Signed)
[id number] ine syndrome   . Morbid obesity 03/23/2013  . Hypercalcemia 03/23/2013  . Other and unspecified hyperlipidemia 03/23/2013  . Other malaise and fatigue 03/23/2013  . RLS (restless legs syndrome) 06/22/2013  . Dehydration 06/22/2013  . Bipolar 1 disorder   . Anxiety     Current Outpatient Prescriptions on File Prior to Visit  Medication Sig Dispense Refill  . acetaminophen (TYLENOL) 500 MG tablet Take 1,000 mg by mouth daily as needed for mild pain or headache.       . albuterol (PROVENTIL HFA;VENTOLIN HFA) 108 (90 BASE) MCG/ACT inhaler Inhale 2 puffs into the lungs every 6 (six) hours as needed for wheezing or shortness of breath.      . clonazePAM (KLONOPIN) 1 MG tablet TAKE 1 TABLET BY MOUTH 3 TIMES DAILY AS NEEDED FOR ANXIETY  90 tablet  0  . Ibuprofen (ADVIL PO) Take 2 tablets by mouth daily as needed (pain/headache).       No current facility-administered medications on file prior to visit.     Allergies  Allergen Reactions  . Tramadol Itching    Family History  Problem Relation Age of Onset  . Other Maternal Grandmother     respiratory  . Alcohol abuse Maternal Grandfather   . Other Maternal Grandfather     suicide  . Arthritis Paternal Grandmother     History   Social History  . Marital Status: Single    Spouse Name: N/A    Number of Children: N/A  . Years of Education: N/A   Social History Main Topics  . Smoking status: Current Every Day Smoker -- 0.20 packs/day    Types: Cigarettes  . Smokeless tobacco: Never Used     Comment: doesn't smoke very much  . Alcohol Use: Yes     Comment: 12 beers a week  . Drug Use: Yes    Special: Marijuana     Comment: no history of drug or alcohol abuse/ addiction  . Sexual Activity: Yes    Partners: Female   Other Topics Concern  . None   Social History Narrative  . None   Review of Systems - See HPI.  All other ROS are negative.  BP 125/85  Pulse 96  Temp(Src) 98 F (36.7 C)  Wt 253 lb (114.76 kg)  SpO2 96%  Physical Exam  Vitals reviewed. Constitutional: He is oriented to person, place, and time and well-developed, well-[phone], andWashingtonoWUJWJ'[id number]:  Head:  Normocephalic and atraumatic.  Cardiovascular: Normal rate, regular rhythm, normal heart sounds and intact distal pulses.   Pulmonary/Chest: Effort normal and breath sounds normal. No respiratory distress. He has no wheezes. He has no rales. He exhibits no tenderness.  Musculoskeletal:       Cervical back: Normal.       Thoracic back: Normal.       Lumbar back: He exhibits tenderness, bony tenderness and pain.  Neurological: He is alert and oriented to person, place, and time. He has normal reflexes.  Skin: Skin is warm and dry. No rash noted.  Psychiatric: Affect normal.   Assessment/Plan: Lumbar back pain with radiculopathy affecting left lower extremity Due to bony tenderness will obtain x-ray of lumbar spine to rule out fracture.   Continue Hydrocodone as directed.  40 mg IM Depo Medrol given today by nursing.  Rx Medrol pack to begin taking as directed tomorrow.  Return precautions discussed with patient.

## 2014-06-23 NOTE — Patient Instructions (Signed)
Please go downstairs for x-ray.  You will be called with your results.  Start Medrol Dosepack (steroids) tomorrow. The shot given in clinic today should begin helping your symptoms.  Continue Hydrocodone as directed.  Avoid heavy lifting or overexertion.  If symptoms are not improving after this week, we will possibly need to obtain an MRI.

## 2014-06-23 NOTE — Assessment & Plan Note (Signed)
Due to bony tenderness will obtain x-ray of lumbar spine to rule out fracture.  Continue Hydrocodone as directed.  40 mg IM Depo Medrol given today by nursing.  Rx Medrol pack to begin taking as directed tomorrow.  Return precautions discussed with patient.

## 2014-06-23 NOTE — Progress Notes (Signed)
Pre visit review using our clinic review tool, if applicable. No additional management support is needed unless otherwise documented below in the visit note. 

## 2014-06-30 ENCOUNTER — Ambulatory Visit (INDEPENDENT_AMBULATORY_CARE_PROVIDER_SITE_OTHER): Payer: BC Managed Care – PPO | Admitting: Family Medicine

## 2014-06-30 ENCOUNTER — Telehealth: Payer: Self-pay | Admitting: Family Medicine

## 2014-06-30 ENCOUNTER — Encounter: Payer: Self-pay | Admitting: Family Medicine

## 2014-06-30 VITALS — BP 124/78 | HR 81 | Temp 97.8°F | Ht 72.0 in | Wt 251.0 lb

## 2014-06-30 DIAGNOSIS — Z23 Encounter for immunization: Secondary | ICD-10-CM

## 2014-06-30 DIAGNOSIS — F319 Bipolar disorder, unspecified: Secondary | ICD-10-CM

## 2014-06-30 DIAGNOSIS — K219 Gastro-esophageal reflux disease without esophagitis: Secondary | ICD-10-CM

## 2014-06-30 DIAGNOSIS — F411 Generalized anxiety disorder: Secondary | ICD-10-CM

## 2014-06-30 DIAGNOSIS — F3130 Bipolar disorder, current episode depressed, mild or moderate severity, unspecified: Secondary | ICD-10-CM

## 2014-06-30 DIAGNOSIS — M26609 Unspecified temporomandibular joint disorder, unspecified side: Secondary | ICD-10-CM

## 2014-06-30 DIAGNOSIS — M5416 Radiculopathy, lumbar region: Secondary | ICD-10-CM

## 2014-06-30 DIAGNOSIS — M545 Low back pain, unspecified: Secondary | ICD-10-CM

## 2014-06-30 DIAGNOSIS — IMO0002 Reserved for concepts with insufficient information to code with codable children: Secondary | ICD-10-CM

## 2014-06-30 HISTORY — DX: Unspecified temporomandibular joint disorder, unspecified side: M26.609

## 2014-06-30 MED ORDER — CLONAZEPAM 1 MG PO TABS: 1.0000 mg | ORAL_TABLET | Freq: Two times a day (BID) | ORAL | Status: DC | PRN

## 2014-06-30 MED ORDER — HYDROCODONE-ACETAMINOPHEN 5-325 MG PO TABS: 1.0000 | ORAL_TABLET | Freq: Two times a day (BID) | ORAL | Status: DC | PRN

## 2014-06-30 MED ORDER — RISPERIDONE 0.5 MG PO TABS: 0.5000 mg | ORAL_TABLET | Freq: Three times a day (TID) | ORAL | Status: DC

## 2014-06-30 NOTE — Telephone Encounter (Signed)
I think he saw [facility], although even though we recommend they actually have to set up their own appt

## 2014-06-30 NOTE — Assessment & Plan Note (Signed)
They have dropped his Clonopin in 1/2 and he is noting increased anxiety, encouraged to try and minimize dosing long term

## 2014-06-30 NOTE — Patient Instructions (Addendum)
Advil=Motrin=Ibuprofen, Aleve=Naproxen all of them are all Nonsteroidal (NSAIDs) Anti inflammatory can take any with food for pain and if no relief sparingly use Hydrocodone Massage with Salon Pas or Aspercreme twice daily, ice or heat, consider seeing the dentist or a mouth at the pharmacy Temporomandibular Problems  Temporomandibular joint (TMJ) dysfunction means there are problems with the joint between your jaw and your skull. This is a joint lined by cartilage like other joints in your body but also has a small disc in the joint which keeps the bones from rubbing on each other. These joints are like other joints and can get inflamed (sore) from arthritis and other problems. When this joint gets sore, it can cause headaches and pain in the jaw and the face. CAUSES  Usually the arthritic types of problems are caused by soreness in the joint. Soreness in the joint can also be caused by overuse. This may come from grinding your teeth. It may also come from mis-alignment in the joint. DIAGNOSIS Diagnosis of this condition can often be made by history and exam. Sometimes your caregiver may need X-rays or an MRI scan to determine the exact cause. It may be necessary to see your dentist to determine if your teeth and jaws are lined up correctly. TREATMENT  Most of the time this problem is not serious; however, sometimes it can persist (become chronic). When this happens medications that will cut down on inflammation (soreness) help. Sometimes a shot of cortisone into the joint will be helpful. If your teeth are not aligned it may help for your dentist to make a splint for your mouth that can help this problem. If no physical problems can be found, the problem may come from tension. If tension is found to be the cause, biofeedback or relaxation techniques may be helpful. HOME CARE INSTRUCTIONS   Later in the day, applications of ice packs may be helpful. Ice can be used in a plastic bag with a towel around  it to prevent frostbite to skin. This may be used about every 2 hours for 20 to 30 minutes, as needed while awake, or as directed by your caregiver.  Only take over-the-counter or prescription medicines for pain, discomfort, or fever as directed by your caregiver.  If physical therapy was prescribed, follow your caregiver's directions.  Wear mouth appliances as directed if they were given. Document Released: 07/04/2001 Document Revised: 01/01/2012 Document Reviewed: 10/11/2008 [facility] Patient Information 2015 [place], [place]. This information is not intended to replace advice given to you by your health care provider. Make sure you discuss any questions you have with your health care provider.
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
## 2014-06-30 NOTE — Assessment & Plan Note (Signed)
Massage with Salon Pas or Aspercreme twice daily, ice or heat, consider seeing the dentist or a mouth guard at the pharmacy, may use hydrocodone very sparingly if NSAIDs and topicals fail

## 2014-06-30 NOTE — Assessment & Plan Note (Signed)
Encouraged DASH diet, decrease po intake and increase exercise as tolerated. Needs 7-8 hours of sleep nightly. Avoid trans fats, eat small, frequent meals every 4-5 hours with lean proteins, complex carbs and healthy fats. Minimize simple carbs, GMO foods. 

## 2014-06-30 NOTE — Assessment & Plan Note (Signed)
Avoid offending foods, start probiotics. Do not eat large meals in late evening and consider raising head of bed.  

## 2014-06-30 NOTE — Assessment & Plan Note (Signed)
Has been seeing [facility] but is struggling with copays, Risperdal has been well tolerating and helping to a good degree but he has only been taking twice daily, he questions gynecomastia but it is not obvious on exam. Would have him try tid for next month and maintain Clonopin only at low dose, is allowed a one month supply while we request records and decide the long term strategy

## 2014-06-30 NOTE — Assessment & Plan Note (Signed)
Much better after steroid shot. Never needed the Medrol dosepak, no changes today,

## 2014-06-30 NOTE — Telephone Encounter (Signed)
Pt does not know the name of the Psychiatrist he was seeing he advised me to check with Dr. Abner Greenspan because she referred him. He is requesting he's medical records but does not know the information please advise.

## 2014-06-30 NOTE — Progress Notes (Signed)
Pre visit review using our clinic review tool, if applicable. No additional management support is needed unless otherwise documented below in the visit note. 

## 2014-07-01 NOTE — Telephone Encounter (Signed)
Thank you :)

## 2014-07-28 ENCOUNTER — Encounter: Payer: Self-pay | Admitting: Family Medicine

## 2014-07-28 ENCOUNTER — Ambulatory Visit (INDEPENDENT_AMBULATORY_CARE_PROVIDER_SITE_OTHER): Payer: BC Managed Care – PPO | Admitting: Family Medicine

## 2014-07-28 VITALS — BP 126/82 | HR 83 | Temp 97.6°F | Ht 72.0 in | Wt 257.4 lb

## 2014-07-28 DIAGNOSIS — M545 Low back pain: Secondary | ICD-10-CM

## 2014-07-28 DIAGNOSIS — L739 Follicular disorder, unspecified: Secondary | ICD-10-CM

## 2014-07-28 DIAGNOSIS — F319 Bipolar disorder, unspecified: Secondary | ICD-10-CM

## 2014-07-28 DIAGNOSIS — M5416 Radiculopathy, lumbar region: Secondary | ICD-10-CM

## 2014-07-28 DIAGNOSIS — M2660 Temporomandibular joint disorder, unspecified: Secondary | ICD-10-CM

## 2014-07-28 DIAGNOSIS — M26609 Unspecified temporomandibular joint disorder, unspecified side: Secondary | ICD-10-CM

## 2014-07-28 DIAGNOSIS — M5417 Radiculopathy, lumbosacral region: Secondary | ICD-10-CM

## 2014-07-28 DIAGNOSIS — K219 Gastro-esophageal reflux disease without esophagitis: Secondary | ICD-10-CM

## 2014-07-28 MED ORDER — RISPERIDONE 0.5 MG PO TABS: 0.5000 mg | ORAL_TABLET | Freq: Three times a day (TID) | ORAL | Status: DC

## 2014-07-28 MED ORDER — HYDROCODONE-ACETAMINOPHEN 5-325 MG PO TABS: 1.0000 | ORAL_TABLET | Freq: Two times a day (BID) | ORAL | Status: DC | PRN

## 2014-07-28 MED ORDER — CLONAZEPAM 1 MG PO TABS: 1.0000 mg | ORAL_TABLET | Freq: Two times a day (BID) | ORAL | Status: DC | PRN

## 2014-07-28 MED ORDER — CEPHALEXIN 500 MG PO CAPS: 500.0000 mg | ORAL_CAPSULE | Freq: Three times a day (TID) | ORAL | Status: DC

## 2014-07-28 NOTE — Progress Notes (Signed)
Pre visit review using our clinic review tool, if applicable. No additional management support is needed unless otherwise documented below in the visit note. 

## 2014-07-28 NOTE — Assessment & Plan Note (Addendum)
Still struggling with symptoms and discomfort. Needs to be seen

## 2014-07-28 NOTE — Patient Instructions (Addendum)
  Find a mouth piece at pharmacy to try  Temporomandibular Problems  Temporomandibular joint (TMJ) dysfunction means there are problems with the joint between your jaw and your skull. This is a joint lined by cartilage like other joints in your body but also has a small disc in the joint which keeps the bones from rubbing on each other. These joints are like other joints and can get inflamed (sore) from arthritis and other problems. When this joint gets sore, it can cause headaches and pain in the jaw and the face. CAUSES  Usually the arthritic types of problems are caused by soreness in the joint. Soreness in the joint can also be caused by overuse. This may come from grinding your teeth. It may also come from mis-alignment in the joint. DIAGNOSIS Diagnosis of this condition can often be made by history and exam. Sometimes your caregiver may need X-rays or an MRI scan to determine the exact cause. It may be necessary to see your dentist to determine if your teeth and jaws are lined up correctly. TREATMENT  Most of the time this problem is not serious; however, sometimes it can persist (become chronic). When this happens medications that will cut down on inflammation (soreness) help. Sometimes a shot of cortisone into the joint will be helpful. If your teeth are not aligned it may help for your dentist to make a splint for your mouth that can help this problem. If no physical problems can be found, the problem may come from tension. If tension is found to be the cause, biofeedback or relaxation techniques may be helpful. HOME CARE INSTRUCTIONS   Later in the day, applications of ice packs may be helpful. Ice can be used in a plastic bag with a towel around it to prevent frostbite to skin. This may be used about every 2 hours for 20 to 30 minutes, as needed while awake, or as directed by your caregiver.  Only take over-the-counter or prescription medicines for pain, discomfort, or fever as directed by  your caregiver.  If physical therapy was prescribed, follow your caregiver's directions.  Wear mouth appliances as directed if they were given. Document Released: 07/04/2001 Document Revised: 01/01/2012 Document Reviewed: 10/11/2008 [facility] Patient Information 2015 [place], [place]. This information is not intended to replace advice given to you by your health care provider. Make sure you discuss any questions you have with your health care provider.

## 2014-07-29 ENCOUNTER — Encounter: Payer: Self-pay | Admitting: Family Medicine

## 2014-07-29 DIAGNOSIS — Z7189 Other specified counseling: Secondary | ICD-10-CM

## 2014-07-29 HISTORY — DX: Other specified counseling: Z71.89

## 2014-08-02 ENCOUNTER — Encounter: Payer: Self-pay | Admitting: Family Medicine
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
## 2014-08-02 NOTE — Assessment & Plan Note (Signed)
Encouraged DASH diet, decrease po intake and increase exercise as tolerated. Needs 7-8 hours of sleep nightly. Avoid trans fats, eat small, frequent meals every 4-5 hours with lean proteins, complex carbs and healthy fats. Minimize simple carbs, GMO foods. 

## 2014-08-02 NOTE — Assessment & Plan Note (Signed)
Encouraged moist heat and gentle stretching as tolerated. May try NSAIDs and prescription meds as directed and report if symptoms worsen or seek immediate care 

## 2014-08-02 NOTE — Assessment & Plan Note (Signed)
Avoid offending foods, start probiotics. Do not eat large meals in late evening and consider raising head of bed.  

## 2014-08-28 ENCOUNTER — Telehealth: Payer: Self-pay | Admitting: Family Medicine

## 2014-08-28 DIAGNOSIS — M545 Low back pain: Secondary | ICD-10-CM

## 2014-08-28 NOTE — Telephone Encounter (Signed)
Caller name:Leclaire, Ladona Ridgelaylor R Relation to pt: self  Call back [phone]   Reason for call:   Pt requesting a refill HYDROcodone-acetaminophen (NORCO/VICODIN) 5-325 MG per tablet. Pt was notified to rsch appt do the provider schedule.

## 2014-08-28 NOTE — Telephone Encounter (Addendum)
rx refill hydrocodone 5-325mg  Last OV- 07/28/14 Last refilled- 07/28/14 #15 / 0 rf  UDS-none

## 2014-08-29 NOTE — Telephone Encounter (Signed)
Ok to refilill his Hydrocodone, same strength, same sig, same number

## 2014-08-31 MED ORDER — HYDROCODONE-ACETAMINOPHEN 5-325 MG PO TABS: 1.0000 | ORAL_TABLET | Freq: Two times a day (BID) | ORAL | Status: DC | PRN

## 2014-08-31 NOTE — Telephone Encounter (Signed)
RX printed for md to sign and detailed message left on patients vm

## 2014-09-23 ENCOUNTER — Telehealth: Payer: Self-pay | Admitting: Family Medicine

## 2014-09-23 DIAGNOSIS — M545 Low back pain: Secondary | ICD-10-CM

## 2014-09-23 NOTE — Telephone Encounter (Signed)
rx refill - hydrocodone 5-325mg   Last OV- 07/28/14 Last refilled- 08/31/14 #15 / 0 rf  UDS- none

## 2014-09-23 NOTE — Telephone Encounter (Signed)
Caller name: [place] Relation to pt: self Call back number: [phone] Pharmacy:  Reason for call:   Patient requesting a new hydrocodone rx. He has appointment scheduled for 10/15/14

## 2014-09-24 ENCOUNTER — Ambulatory Visit: Payer: Self-pay | Admitting: Family Medicine

## 2014-09-24 MED ORDER — HYDROCODONE-ACETAMINOPHEN 5-325 MG PO TABS: 1.0000 | ORAL_TABLET | Freq: Two times a day (BID) | ORAL | Status: DC | PRN

## 2014-09-24 NOTE — Telephone Encounter (Signed)
OK to refill norco 5/325. Same sig, #20

## 2014-09-24 NOTE — Telephone Encounter (Signed)
rx printed / waiting for signature by provider. rx rdy for pick up Friday 12/04.  

## 2014-09-25 NOTE — Telephone Encounter (Signed)
Patient calling back on this. Would like to know if this will be ready for pick up today. I did not see rx up front.

## 2014-09-25 NOTE — Telephone Encounter (Signed)
Pt notified. Done.

## 2014-09-28 ENCOUNTER — Ambulatory Visit: Payer: Self-pay | Admitting: Family Medicine

## 2014-10-15 ENCOUNTER — Ambulatory Visit: Payer: Self-pay | Admitting: Family Medicine

## 2014-10-20 ENCOUNTER — Ambulatory Visit: Payer: BC Managed Care – PPO | Admitting: Family Medicine

## 2014-10-20 ENCOUNTER — Telehealth: Payer: Self-pay | Admitting: *Deleted

## 2014-10-20 NOTE — Telephone Encounter (Signed)
Pt did not show for appointment 10/20/2014 at 10:00am for "8 week follow up/ss--lm to confirm"

## 2014-10-21 NOTE — Telephone Encounter (Signed)
See note below

## 2014-10-21 NOTE — Telephone Encounter (Signed)
He needs no show charged

## 2014-10-30 ENCOUNTER — Ambulatory Visit (INDEPENDENT_AMBULATORY_CARE_PROVIDER_SITE_OTHER): Payer: Self-pay | Admitting: Family Medicine

## 2014-10-30 ENCOUNTER — Encounter: Payer: Self-pay | Admitting: Family Medicine

## 2014-10-30 DIAGNOSIS — F319 Bipolar disorder, unspecified: Secondary | ICD-10-CM

## 2014-10-30 MED ORDER — CLONAZEPAM 1 MG PO TABS: 1.0000 mg | ORAL_TABLET | Freq: Two times a day (BID) | ORAL | Status: DC | PRN

## 2014-10-30 NOTE — Telephone Encounter (Signed)
error:[id number] ° °

## 2014-10-30 NOTE — Telephone Encounter (Deleted)
Pt had an appointment for 10/30/14 requesting $50 No show be waived due to the fact he checked VM and message stated appointment was at 9:45am and grandmother checked on he's behalf and office verified 9:45am. Pt RSC follow up for 11/02/14. Please advise

## 2014-10-30 NOTE — Progress Notes (Signed)
Patient no showed for visit then presented later in day. Is allowed 20 Clonazepam and given an appt for next week for follow up.

## 2014-11-02 ENCOUNTER — Ambulatory Visit (INDEPENDENT_AMBULATORY_CARE_PROVIDER_SITE_OTHER): Payer: BLUE CROSS/BLUE SHIELD | Admitting: Family Medicine

## 2014-11-02 ENCOUNTER — Encounter: Payer: Self-pay | Admitting: Family Medicine

## 2014-11-02 ENCOUNTER — Telehealth: Payer: Self-pay | Admitting: *Deleted

## 2014-11-02 VITALS — BP 132/84 | HR 80 | Temp 97.7°F | Ht 72.0 in | Wt 244.8 lb

## 2014-11-02 DIAGNOSIS — F319 Bipolar disorder, unspecified: Secondary | ICD-10-CM

## 2014-11-02 DIAGNOSIS — F3130 Bipolar disorder, current episode depressed, mild or moderate severity, unspecified: Secondary | ICD-10-CM

## 2014-11-02 DIAGNOSIS — F17200 Nicotine dependence, unspecified, uncomplicated: Secondary | ICD-10-CM

## 2014-11-02 DIAGNOSIS — K219 Gastro-esophageal reflux disease without esophagitis: Secondary | ICD-10-CM

## 2014-11-02 DIAGNOSIS — M545 Low back pain: Secondary | ICD-10-CM

## 2014-11-02 DIAGNOSIS — M5417 Radiculopathy, lumbosacral region: Secondary | ICD-10-CM

## 2014-11-02 DIAGNOSIS — Z720 Tobacco use: Secondary | ICD-10-CM

## 2014-11-02 DIAGNOSIS — M5416 Radiculopathy, lumbar region: Secondary | ICD-10-CM

## 2014-11-02 MED ORDER — ALBUTEROL SULFATE HFA 108 (90 BASE) MCG/ACT IN AERS
2.0000 | INHALATION_SPRAY | Freq: Four times a day (QID) | RESPIRATORY_TRACT | Status: DC | PRN
Start: 2014-11-02 — End: 2020-02-25

## 2014-11-02 MED ORDER — HYDROCODONE-ACETAMINOPHEN 5-325 MG PO TABS: 1.0000 | ORAL_TABLET | Freq: Two times a day (BID) | ORAL | Status: DC | PRN

## 2014-11-02 MED ORDER — CLONAZEPAM 1 MG PO TABS: 1.0000 mg | ORAL_TABLET | Freq: Two times a day (BID) | ORAL | Status: DC | PRN

## 2014-11-02 NOTE — Patient Instructions (Signed)
Witch Hazel Astringent and Benzoyl Peroxide to lesion on chin twice a day  Nicotine Addiction Nicotine can act as both a stimulant (excites/activates) and a sedative (calms/quiets). Immediately after exposure to nicotine, there is a "kick" caused in part by the drug's stimulation of the adrenal glands and resulting discharge of adrenaline (epinephrine). The rush of adrenaline stimulates the body and causes a sudden release of sugar. This means that smokers are always slightly hyperglycemic. Hyperglycemic means that the blood sugar is high, just like in diabetics. Nicotine also decreases the amount of insulin which helps control sugar levels in the body. There is an increase in blood pressure, breathing, and the rate of heart beats.  In addition, nicotine indirectly causes a release of dopamine in the brain that controls pleasure and motivation. A similar reaction is seen with other drugs of abuse, such as cocaine and heroin. This dopamine release is thought to cause the pleasurable sensations when smoking. In some different cases, nicotine can also create a calming effect, depending on sensitivity of the smoker's nervous system and the dose of nicotine taken. WHAT HAPPENS WHEN NICOTINE IS TAKEN FOR LONG PERIODS OF TIME?  Long-term use of nicotine results in addiction. It is difficult to stop.  Repeated use of nicotine creates tolerance. Higher doses of nicotine are needed to get the "kick." When nicotine use is stopped, withdrawal may last a month or more. Withdrawal may begin within a few hours after the last cigarette. Symptoms peak within the first few days and may lessen within a few weeks. For some people, however, symptoms may last for months or longer. Withdrawal symptoms include:   Irritability.  Craving.  Learning and attention deficits.  Sleep disturbances.  Increased appetite. Craving for tobacco may last for 6 months or longer. Many behaviors done while using nicotine can also play a  part in the severity of withdrawal symptoms. For some people, the feel, smell, and sight of a cigarette and the ritual of obtaining, handling, lighting, and smoking the cigarette are closely linked with the pleasure of smoking. When stopped, they also miss the related behaviors which make the withdrawal or craving worse. While nicotine gum and patches may lessen the drug aspects of withdrawal, cravings often persist. WHAT ARE THE MEDICAL CONSEQUENCES OF NICOTINE USE?  Nicotine addiction accounts for one-third of all cancers. The top cancer caused by tobacco is lung cancer. Lung cancer is the number one cancer killer of both men and women.  Smoking is also associated with cancers of the:  Mouth.  Pharynx.  Larynx.  Esophagus.  Stomach.  Pancreas.  Cervix.  Kidney.  Ureter.  Bladder.  Smoking also causes lung diseases such as lasting (chronic) bronchitis and emphysema.  It worsens asthma in adults and children.  Smoking increases the risk of heart disease, including:  Stroke.  Heart attack.  Vascular disease.  Aneurysm.  Passive or secondary smoke can also increase medical risks including:  Asthma in children.  Sudden Infant Death Syndrome (SIDS).  Additionally, dropped cigarettes are the leading cause of residential fire fatalities.  Nicotine poisoning has been reported from accidental ingestion of tobacco products by children and pets. Death usually results in a few minutes from respiratory failure (when a person stops breathing) caused by paralysis. TREATMENT   Medication. Nicotine replacement medicines such as nicotine gum and the patch are used to stop smoking. These medicines gradually lower the dosage of nicotine in the body. These medicines do not contain the carbon monoxide and other toxins found  in tobacco smoke.  Hypnotherapy.  Relaxation therapy.  Nicotine Anonymous (a 12-step support program). Find times and locations in your local yellow  pages. Document Released: 06/14/2004 Document Revised: 01/01/2012 Document Reviewed: 12/05/2013 [facility] Patient Information 2015 [place], [place]. This information is not intended to replace advice given to you by your health care provider. Make sure you discuss any questions you have with your health care provider.

## 2014-11-02 NOTE — Telephone Encounter (Signed)
Prior authorization for quantity limits [facility]. Awaiting determination. JG//CMA
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
## 2014-11-02 NOTE — Progress Notes (Signed)
Pre visit review using our clinic review tool, if applicable. No additional management support is needed unless otherwise documented below in the visit note. 

## 2014-11-04 NOTE — Telephone Encounter (Signed)
OK switch his Risperdal to the 1 mg tabs, take 1 1/2 tabs po daily disp #45 with 1 rf

## 2014-11-04 NOTE — Telephone Encounter (Signed)
PA (risperidone 0.5 mg) denied for 3 pills per day, insurance will cover 2 pills per day. Insurance would also cover one and a half 1 mg pills a day. Please advise. JG//CMA

## 2014-11-05 MED ORDER — RISPERIDONE 1 MG PO TABS: ORAL_TABLET | ORAL | Status: DC

## 2014-11-05 NOTE — Addendum Note (Signed)
Addended by: Amado CoeGLOVER, Maecy Podgurski A on: 11/05/2014 07:14 AM   Modules accepted: Orders, Medications

## 2014-11-08 ENCOUNTER — Encounter: Payer: Self-pay | Admitting: Family Medicine

## 2014-11-08 NOTE — Assessment & Plan Note (Signed)
Does heavy lifting at work and is doing OK with minimzal pain meds. Encouraged Tylenol prn, Salon pas prn and Curcumen

## 2014-11-08 NOTE — Assessment & Plan Note (Signed)
Encouraged DASH diet, decrease po intake and increase exercise as tolerated. Needs 7-8 hours of sleep nightly. Avoid trans fats, eat small, frequent meals every 4-5 hours with lean proteins, complex carbs and healthy fats. Minimize simple carbs, GMO foods. 

## 2014-11-08 NOTE — Assessment & Plan Note (Signed)
Continues to struggle with stress and anxiety and is commited to inpatient care soon to help stabilize his condition. Will continue current meds for now since he is managing without suicidal ideation on current meds

## 2014-11-08 NOTE — Assessment & Plan Note (Signed)
Encouraged complete cessation. Discussed need to quit as relates to risk of numerous cancers, cardiac and pulmonary disease as well as neurologic complications. Counseled for greater than 3 minutes 

## 2014-11-08 NOTE — Assessment & Plan Note (Signed)
Avoid offending foods, start probiotics. Do not eat large meals in late evening and consider raising head of bed.  

## 2014-11-30 ENCOUNTER — Telehealth: Payer: Self-pay | Admitting: Family Medicine

## 2014-11-30 DIAGNOSIS — M545 Low back pain: Secondary | ICD-10-CM

## 2014-11-30 NOTE — Telephone Encounter (Signed)
Caller name: Cannan Relation to pt: self Call back number: [phone] Pharmacy:  Reason for call:   Requesting a new hydrocodone rx

## 2014-11-30 NOTE — Telephone Encounter (Signed)
Ok to refill hydrocodone, same strength, same sig, same number

## 2014-12-01 MED ORDER — HYDROCODONE-ACETAMINOPHEN 5-325 MG PO TABS
1.0000 | ORAL_TABLET | Freq: Two times a day (BID) | ORAL | Status: DC | PRN
Start: 2014-12-01 — End: 2014-12-09

## 2014-12-01 NOTE — Telephone Encounter (Signed)
Script printed per PCP instructions.  Called the patient informed to pickup hardcopy at the front desk.

## 2014-12-08 ENCOUNTER — Telehealth: Payer: Self-pay | Admitting: Family Medicine

## 2014-12-08 DIAGNOSIS — N503 Cyst of epididymis: Secondary | ICD-10-CM

## 2014-12-08 DIAGNOSIS — N451 Epididymitis: Secondary | ICD-10-CM

## 2014-12-08 NOTE — Telephone Encounter (Signed)
Caller name: Ocie CornfieldHolland, Geofrey R Relation to pt: SELF Call back number: [phone] Pharmacy:  Reason for call:  Pt states he is expercicing pain in testicles and requesting a refill HYDROcodone-acetaminophen (NORCO/VICODIN) 5-325 MG per tablet

## 2014-12-08 NOTE — Telephone Encounter (Signed)
It looks like he was given a prescription on 2/9 for 20 hydrocodone. If he is in enough pain to need more already he probably needs to have an examination

## 2014-12-09 ENCOUNTER — Ambulatory Visit (HOSPITAL_BASED_OUTPATIENT_CLINIC_OR_DEPARTMENT_OTHER)
Admission: RE | Admit: 2014-12-09 | Discharge: 2014-12-09 | Disposition: A | Discharge status: Home / Self Care | Payer: BLUE CROSS/BLUE SHIELD | Source: Ambulatory Visit | Attending: Physician Assistant | Admitting: Physician Assistant

## 2014-12-09 ENCOUNTER — Encounter: Payer: Self-pay | Admitting: Physician Assistant

## 2014-12-09 ENCOUNTER — Other Ambulatory Visit (HOSPITAL_BASED_OUTPATIENT_CLINIC_OR_DEPARTMENT_OTHER): Payer: Self-pay | Admitting: Family Medicine

## 2014-12-09 ENCOUNTER — Ambulatory Visit (INDEPENDENT_AMBULATORY_CARE_PROVIDER_SITE_OTHER): Payer: BLUE CROSS/BLUE SHIELD | Admitting: Physician Assistant

## 2014-12-09 VITALS — BP 136/82 | HR 122 | Temp 97.9°F | Resp 18 | Ht 72.0 in | Wt 245.1 lb

## 2014-12-09 DIAGNOSIS — N50811 Right testicular pain: Secondary | ICD-10-CM

## 2014-12-09 DIAGNOSIS — N433 Hydrocele, unspecified: Secondary | ICD-10-CM | POA: Diagnosis not present

## 2014-12-09 DIAGNOSIS — M545 Low back pain: Secondary | ICD-10-CM

## 2014-12-09 DIAGNOSIS — R222 Localized swelling, mass and lump, trunk: Secondary | ICD-10-CM | POA: Diagnosis present

## 2014-12-09 DIAGNOSIS — N508 Other specified disorders of male genital organs: Secondary | ICD-10-CM

## 2014-12-09 LAB — POCT URINALYSIS DIPSTICK
Bilirubin, UA: NEGATIVE
Blood, UA: NEGATIVE
Glucose, UA: NEGATIVE
KETONES UA: POSITIVE
Leukocytes, UA: NEGATIVE
Nitrite, UA: NEGATIVE
PH UA: 6
Protein, UA: POSITIVE
Spec Grav, UA: >=1.03
Urobilinogen, UA: 0.2

## 2014-12-09 MED ORDER — HYDROCODONE-ACETAMINOPHEN 5-325 MG PO TABS: 2.0000 | ORAL_TABLET | Freq: Two times a day (BID) | ORAL | Status: AC | PRN

## 2014-12-09 MED ORDER — LEVOFLOXACIN 500 MG PO TABS: 500.0000 mg | ORAL_TABLET | Freq: Every day | ORAL | Status: DC

## 2014-12-09 MED ORDER — HYDROCODONE-ACETAMINOPHEN 5-325 MG PO TABS: 1.0000 | ORAL_TABLET | Freq: Two times a day (BID) | ORAL | Status: DC | PRN

## 2014-12-09 NOTE — Progress Notes (Signed)
Pre visit review using our clinic review tool, if applicable. No additional management support is needed unless otherwise documented below in the visit note/SLS  

## 2014-12-09 NOTE — Assessment & Plan Note (Signed)
Urine dip unremarkable. Will send for culture.  Will obtain STAT Scrotal US with Doppler art/venous abdomen/pelvis to further assess.  Pain medication given today to use for severe pain.  Supportive undergarments.  Alarm signs/symptoms discussed with patient.

## 2014-12-09 NOTE — Progress Notes (Signed)
Patient presents to clinic today c/o R inguinal pain with swelling x 3 months, acutely worsening over the past 7 days.  Pain ranked at 8/10 presently.  Denies fever, nausea or vomiting.  Denies heavy lifting or history of hernia.  Denies urinary symptoms. Is sexually active with one male partner, in a monogamous relationship.  Endorses going to UC yesterday due to pain.  Workup was obtained, all unremarkable.  Urine culture pending per patient.  They had recommended [place] to further assess.  Past Medical History  Diagnosis Date  . Asthma     Childhood; quiescent since age 14  . ADHD (attention deficit hyperactivity disorder)     treated successfully in middle and HS with adderall; has done well with switch over to vyvanse  . GAD (generalized anxiety disorder)     Mixed sx's (anxiety/panic/irritability/depression) in the past resulted in trials of several SSRIs, which apparently made him more irritable.  Trials of seroquel and abilify resulted in excessive wt gain and didn't help much.  Buspar was oversedating.  Citalopram  qd x 59mo NO HELP (2012)--ultimately decided to stick with clonaz bid and this has been effective.  . Migraine syndrome   . Morbid obesity 03/23/2013  . Hypercalcemia 03/23/2013  . Other and unspecified hyperlipidemia 03/23/2013  . Other malaise and fatigue 03/23/2013  . RLS (restless legs syndrome) 06/22/2013  . Dehydration 06/22/2013  . Bipolar 1 disorder   . Anxiety   . 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 Advanced care planning/counseling discussion 07/29/2014    Current Outpatient Prescriptions on File Prior to Visit  Medication Sig Dispense Refill  . acetaminophen (TYLENOL) 500 MG tablet Take 1,000 mg by mouth daily as needed for mild pain or headache.     . albuterol (PROVENTIL HFA;VENTOLIN HFA) 108 (90 BASE) MCG/ACT inhaler Inhale 2 puffs into the lungs every 6 (six) hours as needed for wheezing or shortness of breath. 1 Inhaler 3  . clonazePAM (KLONOPIN) 1 MG tablet Take 1  tablet (1 mg total) by mouth 2 (two) times daily as needed for anxiety. 60 tablet 1  . Ibuprofen (ADVIL PO) Take 2 tablets by mouth daily as needed (pain/headache).    . risperiDONE (RISPERDAL) 1 MG tablet Take 1 and one half tablets by mouth once daily 45 tablet 1   No current facility-administered medications on file prior to visit.    Allergies  Allergen Reactions  . Tramadol Itching    Family History  Problem Relation Age of Onset  . Other Maternal Grandmother     respiratory  . Alcohol abuse Maternal Grandfather   . Other Maternal Grandfather     suicide  . Arthritis Paternal Grandmother     History   Social History  . Marital Status: Single    Spouse Name: N/A  . Number of Children: N/A  . Years of Education: N/A   Social History Main Topics  . Smoking status: Former Smoker -- 0.20 packs/day    Types: Cigarettes    Quit date: 06/09/2014  . Smokeless tobacco: Never Used     Comment: doesn't smoke very much  . Alcohol Use: Yes     Comment: 12 beers a week  . Drug Use: Yes    Special: Marijuana     Comment: no history of drug or alcohol abuse/ addiction  . Sexual Activity:    Partners: Female   Other Topics Concern  . None   Social History Narrative    Review of Systems - See  HPI.  All other ROS are negative.  BP 136/82 mmHg  Pulse 122  Temp(Src) 97.9 F (36.6 C) (Oral)  Resp 18  Ht 6' (1.829 m)  Wt 245 lb 2 oz (111.188 kg)  BMI 33.24 kg/m2  SpO2 97%  Physical Exam  Constitutional: He is oriented to person, place, and time and well-developed, well-nourished, and in no distress.  HENT:  Head: Normocephalic and atraumatic.  Cardiovascular: Regular rhythm, normal heart sounds and intact distal pulses.   Pulmonary/Chest: Effort normal and breath sounds normal. No respiratory distress. He has no wheezes. He has no rales. He exhibits no tenderness.  Abdominal: Bowel sounds are normal. There is no hepatosplenomegaly. There is no tenderness. Hernia  confirmed negative in the umbilical area, confirmed negative in the ventral area and confirmed negative in the left inguinal area.  Hard to assess for inguinal hernia giving severe pain and patient movement during exam.  Genitourinary: Penis normal. He exhibits testicular tenderness and scrotal tenderness. He exhibits no abnormal testicular mass, no abnormal scrotal mass and no epididymal tenderness. Cremasteric reflex is present. Penis exhibits no lesions. No discharge found.  Neurological: He is alert and oriented to person, place, and time.  Skin: Skin is warm and dry. No rash noted.  Psychiatric: Affect normal.  Vitals reviewed.   Recent Results (from the past 2160 hour(s))  POCT urinalysis dipstick     Status: None   Collection Time: 12/09/14  4:09 PM  Result Value Ref Range   Color, UA amber    Clarity, UA dark    Glucose, UA neg    Bilirubin, UA neg    Ketones, UA positive    Spec Grav, UA >=1.030    Blood, UA neg    pH, UA 6.0    Protein, UA positive     Comment: Large   Urobilinogen, UA 0.2    Nitrite, UA neg    Leukocytes, UA Negative     Assessment/Plan: Testicular pain, right Urine dip unremarkable. Will send for culture.  Will obtain STAT Scrotal US with Doppler art/venous abdomen/pelvis to further assess.  Pain medication given today to use for severe pain.  Supportive undergarments.  Alarm signs/symptoms discussed with patient.

## 2014-12-09 NOTE — Patient Instructions (Signed)
Please take Hydrocodone as directed.  Continue Mobic. Avoid heavy lifting or overexertion. Please go downstairs for your Imaging.  They will call me with your results before letting you leave. If you develop worsening of pain, fever, or Nausea/vomiting before our workup is complete, please go to the ER.

## 2014-12-09 NOTE — Telephone Encounter (Signed)
US results discussed with patient.  Rx Levaquin for epididymitis. Referral placed to urology for complex cysts noted on US.  Follow-up 1 week.  Patient understood and agreed.

## 2014-12-10 LAB — CULTURE, URINE COMPREHENSIVE
COLONY COUNT: NO GROWTH
ORGANISM ID, BACTERIA: NO GROWTH

## 2014-12-16 ENCOUNTER — Encounter: Payer: Self-pay | Admitting: Family Medicine

## 2014-12-28 ENCOUNTER — Telehealth: Payer: Self-pay | Admitting: Family Medicine

## 2014-12-28 DIAGNOSIS — F319 Bipolar disorder, unspecified: Secondary | ICD-10-CM

## 2014-12-28 MED ORDER — RISPERIDONE 1 MG PO TABS: ORAL_TABLET | ORAL | Status: AC

## 2014-12-28 MED ORDER — CLONAZEPAM 1 MG PO TABS
1.0000 mg | ORAL_TABLET | Freq: Two times a day (BID) | ORAL | Status: AC | PRN
Start: 2014-12-28 — End: ?

## 2014-12-28 NOTE — Telephone Encounter (Signed)
risperdal sent in electronically. Printed clonazepam and out on counter for signature

## 2014-12-28 NOTE — Telephone Encounter (Signed)
Faxed hardcopy for clonazepam to [facility] [place].

## 2014-12-28 NOTE — Telephone Encounter (Signed)
Caller name:Theodus Hitch Relationship to patient:self Can be [phone] [facility] [place]  Reason for call:Requesting refills on clonazepam, hyrocodone and risperdal

## 2014-12-28 NOTE — Telephone Encounter (Signed)
He can have a refill on his Risperdal and his Clonazepam same strength same sig, same number 1 rf each but the Hydrocodone is not due til the 17th

## 2014-12-30 ENCOUNTER — Other Ambulatory Visit: Payer: Self-pay | Admitting: Family Medicine

## 2015-01-03 ENCOUNTER — Other Ambulatory Visit: Payer: Self-pay | Admitting: Family Medicine

## 2015-01-04 ENCOUNTER — Ambulatory Visit: Payer: BLUE CROSS/BLUE SHIELD | Admitting: Family Medicine

## 2015-01-07 ENCOUNTER — Telehealth: Payer: Self-pay | Admitting: Family Medicine

## 2015-01-07 ENCOUNTER — Encounter: Payer: Self-pay | Admitting: Family Medicine

## 2015-01-07 NOTE — Telephone Encounter (Signed)
Please check how many no shows in last 6 months

## 2015-01-07 NOTE — Telephone Encounter (Signed)
Pt was no show for follow up appt on 01/04/15. Letter sent- charge no show?

## 2015-01-08 ENCOUNTER — Encounter: Payer: Self-pay | Admitting: Family Medicine

## 2015-01-08 NOTE — Telephone Encounter (Signed)
Form completed with patient demographics put on PCP's desk to complete and will give to manager to mail.

## 2015-01-08 NOTE — Telephone Encounter (Signed)
I have printed a dismissal letter due to recurrent no shows.

## 2015-01-08 NOTE — Telephone Encounter (Signed)
3 no shows since 09/2014

## 2015-01-12 ENCOUNTER — Telehealth: Payer: Self-pay | Admitting: Family Medicine

## 2015-01-12 NOTE — Telephone Encounter (Signed)
Patient dismissed [facility] by Danise EdgeStacey Blyth MD , effective January 08, 2015. Dismissal letter sent out by certified / registered mail. DAJ  Received signed domestic return receipt verifying delivery of certified letter on January 29, 2015. Article number [id number]

## 2015-01-13 ENCOUNTER — Telehealth: Payer: Self-pay | Admitting: Family Medicine

## 2015-01-13 ENCOUNTER — Ambulatory Visit: Payer: BLUE CROSS/BLUE SHIELD | Admitting: Physician Assistant

## 2015-01-13 NOTE — Telephone Encounter (Signed)
He will need to find a slot with Dr. Abner GreenspanBlyth

## 2015-01-13 NOTE — Telephone Encounter (Signed)
vm full. Could not leave a message.
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
## 2015-02-18 NOTE — Telephone Encounter (Deleted)
error:[id number] ° °

## 2015-02-18 NOTE — Telephone Encounter (Addendum)
error:315308 ° °

## 2015-07-06 ENCOUNTER — Encounter: Payer: Self-pay | Admitting: Family Medicine

## 2019-06-24 ENCOUNTER — Emergency Department (HOSPITAL_COMMUNITY)
Admission: EM | Admit: 2019-06-24 | Discharge: 2019-06-24 | Disposition: A | Discharge status: Home / Self Care | Payer: BC Managed Care – PPO | Attending: Emergency Medicine | Admitting: Emergency Medicine

## 2019-06-24 ENCOUNTER — Emergency Department (HOSPITAL_COMMUNITY): Payer: BC Managed Care – PPO

## 2019-06-24 ENCOUNTER — Other Ambulatory Visit: Payer: Self-pay

## 2019-06-24 ENCOUNTER — Encounter (HOSPITAL_COMMUNITY): Payer: Self-pay | Admitting: Emergency Medicine

## 2019-06-24 DIAGNOSIS — F1210 Cannabis abuse, uncomplicated: Secondary | ICD-10-CM | POA: Insufficient documentation

## 2019-06-24 DIAGNOSIS — W19XXXA Unspecified fall, initial encounter: Secondary | ICD-10-CM | POA: Insufficient documentation

## 2019-06-24 DIAGNOSIS — Z79899 Other long term (current) drug therapy: Secondary | ICD-10-CM | POA: Insufficient documentation

## 2019-06-24 DIAGNOSIS — Y998 Other external cause status: Secondary | ICD-10-CM | POA: Insufficient documentation

## 2019-06-24 DIAGNOSIS — Y9301 Activity, walking, marching and hiking: Secondary | ICD-10-CM | POA: Diagnosis not present

## 2019-06-24 DIAGNOSIS — R531 Weakness: Secondary | ICD-10-CM | POA: Insufficient documentation

## 2019-06-24 DIAGNOSIS — J45909 Unspecified asthma, uncomplicated: Secondary | ICD-10-CM | POA: Insufficient documentation

## 2019-06-24 DIAGNOSIS — Z87891 Personal history of nicotine dependence: Secondary | ICD-10-CM | POA: Diagnosis not present

## 2019-06-24 DIAGNOSIS — M79661 Pain in right lower leg: Secondary | ICD-10-CM | POA: Diagnosis not present

## 2019-06-24 DIAGNOSIS — Y92018 Other place in single-family (private) house as the place of occurrence of the external cause: Secondary | ICD-10-CM | POA: Diagnosis not present

## 2019-06-24 LAB — URINALYSIS, ROUTINE W REFLEX MICROSCOPIC
Bilirubin Urine: NEGATIVE
Glucose, UA: NEGATIVE mg/dL
Hgb urine dipstick: NEGATIVE
Ketones, ur: NEGATIVE mg/dL
Leukocytes,Ua: NEGATIVE
Nitrite: NEGATIVE
Protein, ur: NEGATIVE mg/dL
Specific Gravity, Urine: 1.014 (ref 1.005–1.030)
pH: 7 (ref 5.0–8.0)

## 2019-06-24 LAB — CBC WITH DIFFERENTIAL/PLATELET
Abs Immature Granulocytes: 0.04 10*3/uL (ref 0.00–0.07)
Basophils Absolute: 0 10*3/uL (ref 0.0–0.1)
Basophils Relative: 0 %
Eosinophils Absolute: 0.1 10*3/uL (ref 0.0–0.5)
Eosinophils Relative: 1 %
HCT: 42.6 % (ref 39.0–52.0)
Hemoglobin: 13.8 g/dL (ref 13.0–17.0)
Immature Granulocytes: 0 %
Lymphocytes Relative: 21 %
Lymphs Abs: 1.9 10*3/uL (ref 0.7–4.0)
MCH: 29.4 pg (ref 26.0–34.0)
MCHC: 32.4 g/dL (ref 30.0–36.0)
MCV: 90.8 fL (ref 80.0–100.0)
Monocytes Absolute: 0.6 10*3/uL (ref 0.1–1.0)
Monocytes Relative: 7 %
Neutro Abs: 6.4 10*3/uL (ref 1.7–7.7)
Neutrophils Relative %: 71 %
Platelets: 244 10*3/uL (ref 150–400)
RBC: 4.69 MIL/uL (ref 4.22–5.81)
RDW: 12.9 % (ref 11.5–15.5)
WBC: 9.1 10*3/uL (ref 4.0–10.5)
nRBC: 0 % (ref 0.0–0.2)

## 2019-06-24 LAB — COMPREHENSIVE METABOLIC PANEL
ALT: 26 U/L (ref 0–44)
AST: 15 U/L (ref 15–41)
Albumin: 4.3 g/dL (ref 3.5–5.0)
Alkaline Phosphatase: 61 U/L (ref 38–126)
Anion gap: 8 (ref 5–15)
BUN: 12 mg/dL (ref 6–20)
CO2: 28 mmol/L (ref 22–32)
Calcium: 9.8 mg/dL (ref 8.9–10.3)
Chloride: 101 mmol/L (ref 98–111)
Creatinine, Ser: 0.88 mg/dL (ref 0.61–1.24)
GFR calc Af Amer: >60 mL/min (ref >60)
GFR calc non Af Amer: >60 mL/min (ref >60)
Glucose, Bld: 97 mg/dL (ref 70–99)
Potassium: 5 mmol/L (ref 3.5–5.1)
Sodium: 137 mmol/L (ref 135–145)
Total Bilirubin: 0.4 mg/dL (ref 0.3–1.2)
Total Protein: 8 g/dL (ref 6.5–8.1)

## 2019-06-24 LAB — RAPID URINE DRUG SCREEN, HOSP PERFORMED
Amphetamines: POSITIVE — AB
Barbiturates: NOT DETECTED
Benzodiazepines: POSITIVE — AB
Cocaine: NOT DETECTED
Opiates: POSITIVE — AB
Tetrahydrocannabinol: POSITIVE — AB

## 2019-06-24 MED ORDER — SODIUM CHLORIDE 0.9 % IV BOLUS
1000.0000 mL | Freq: Once | INTRAVENOUS | Status: AC
  Administered 2019-06-24: 15:00:00 1000 mL via INTRAVENOUS
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
## 2019-06-24 NOTE — Discharge Instructions (Addendum)
Your labs today are reassuring. Home to rest, push hydrating fluids. Recheck with your doctor. Return to ER for new or worsening symptoms.

## 2019-06-24 NOTE — ED Notes (Signed)
Pt made aware of need for urine sample.  

## 2019-06-24 NOTE — ED Triage Notes (Signed)
Pt reports today was walking and legs got weak and gave out on him. Reports back was hurting and had to crawl over to cough and got on it. Pt reports that he stood up from cough and fell again.

## 2020-02-11 ENCOUNTER — Encounter: Payer: Self-pay | Admitting: Nurse Practitioner

## 2020-02-24 ENCOUNTER — Ambulatory Visit: Payer: BC Managed Care – PPO | Admitting: Nurse Practitioner

## 2020-02-25 ENCOUNTER — Ambulatory Visit: Payer: BC Managed Care – PPO | Admitting: Physician Assistant

## 2020-02-25 ENCOUNTER — Encounter: Payer: Self-pay | Admitting: Physician Assistant

## 2020-02-25 VITALS — BP 144/70 | HR 64 | Temp 98.3°F | Ht 72.0 in | Wt 266.8 lb

## 2020-02-25 DIAGNOSIS — K6289 Other specified diseases of anus and rectum: Secondary | ICD-10-CM | POA: Diagnosis not present

## 2020-02-25 DIAGNOSIS — K5909 Other constipation: Secondary | ICD-10-CM | POA: Diagnosis not present

## 2020-02-25 DIAGNOSIS — K625 Hemorrhage of anus and rectum: Secondary | ICD-10-CM | POA: Diagnosis not present

## 2020-02-25 DIAGNOSIS — Z01818 Encounter for other preprocedural examination: Secondary | ICD-10-CM

## 2020-02-25 MED ORDER — LUBIPROSTONE 8 MCG PO CAPS: 8.0000 ug | ORAL_CAPSULE | Freq: Two times a day (BID) | ORAL | 2 refills | Status: DC

## 2020-02-25 MED ORDER — NA SULFATE-K SULFATE-MG SULF 17.5-3.13-1.6 GM/177ML PO SOLN: 1.0000 | Freq: Once | ORAL | 0 refills | Status: AC
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
## 2020-02-25 NOTE — Patient Instructions (Addendum)
If you are age 30 or older, your body mass index should be between 23-30. Your Body mass index is 36.18 kg/m. If this is out of the aforementioned range listed, please consider follow up with your Primary Care Provider.  If you are age 81 or younger, your body mass index should be between 19-25. Your Body mass index is 36.18 kg/m. If this is out of the aformentioned range listed, please consider follow up with your Primary Care Provider.   We have sent the following medications to your pharmacy for you to pick up at your convenience: Amitiza 8 mcg twice daily (samples given).

## 2020-02-27 ENCOUNTER — Telehealth: Payer: Self-pay | Admitting: *Deleted

## 2020-02-27 NOTE — Telephone Encounter (Signed)
PA submitted and approved for Amitiza 8 mcg twice daily.

## 2020-03-03 NOTE — Progress Notes (Signed)
Addendum: Reviewed and agree with assessment and management plan. Jaydah Stahle M, MD  

## 2020-03-04 ENCOUNTER — Encounter: Payer: Self-pay | Admitting: Internal Medicine

## 2020-03-16 ENCOUNTER — Encounter: Payer: Self-pay | Admitting: Internal Medicine

## 2020-03-19 ENCOUNTER — Other Ambulatory Visit: Payer: Self-pay | Admitting: Internal Medicine

## 2020-03-19 ENCOUNTER — Ambulatory Visit (INDEPENDENT_AMBULATORY_CARE_PROVIDER_SITE_OTHER): Payer: BC Managed Care – PPO

## 2020-03-19 DIAGNOSIS — Z1159 Encounter for screening for other viral diseases: Secondary | ICD-10-CM

## 2020-03-19 LAB — SARS CORONAVIRUS 2 (TAT 6-24 HRS): SARS Coronavirus 2: NEGATIVE

## 2020-03-23 ENCOUNTER — Ambulatory Visit (AMBULATORY_SURGERY_CENTER): Payer: BC Managed Care – PPO | Admitting: Internal Medicine

## 2020-03-23 ENCOUNTER — Other Ambulatory Visit: Payer: Self-pay

## 2020-03-23 ENCOUNTER — Encounter: Payer: Self-pay | Admitting: Internal Medicine

## 2020-03-23 VITALS — BP 110/51 | HR 76 | Temp 97.3°F | Resp 17 | Ht 72.0 in | Wt 265.0 lb

## 2020-03-23 DIAGNOSIS — K5909 Other constipation: Secondary | ICD-10-CM

## 2020-03-23 DIAGNOSIS — K625 Hemorrhage of anus and rectum: Secondary | ICD-10-CM

## 2020-03-23 MED ORDER — SODIUM CHLORIDE 0.9 % IV SOLN: 500.0000 mL | Freq: Once | INTRAVENOUS | Status: DC

## 2020-03-23 MED ORDER — LUBIPROSTONE 24 MCG PO CAPS: 24.0000 ug | ORAL_CAPSULE | Freq: Two times a day (BID) | ORAL | 3 refills | Status: AC

## 2020-03-23 NOTE — Patient Instructions (Signed)
Increase Amitiza to twice daily ( this is best taken with food)    YOU HAD AN ENDOSCOPIC PROCEDURE TODAY AT THE [facility]:   Refer to the procedure report that was given to you for any specific questions about what was found during the examination.  If the procedure report does not answer your questions, please call your gastroenterologist to clarify.  If you requested that your care partner not be given the details of your procedure findings, then the procedure report has been included in a sealed envelope for you to review at your convenience later.  YOU SHOULD EXPECT: Some feelings of bloating in the abdomen. Passage of more gas than usual.  Walking can help get rid of the air that was put into your GI tract during the procedure and reduce the bloating. If you had a lower endoscopy (such as a colonoscopy or flexible sigmoidoscopy) you may notice spotting of blood in your stool or on the toilet paper. If you underwent a bowel prep for your procedure, you may not have a normal bowel movement for a few days.  Please Note:  You might notice some irritation and congestion in your nose or some drainage.  This is from the oxygen used during your procedure.  There is no need for concern and it should clear up in a day or so.  SYMPTOMS TO REPORT IMMEDIATELY:   Following lower endoscopy (colonoscopy or flexible sigmoidoscopy):  Excessive amounts of blood in the stool  Significant tenderness or worsening of abdominal pains  Swelling of the abdomen that is new, acute  Fever of 100F or higher   For urgent or emergent issues, a gastroenterologist can be reached at any hour by calling [phone]. Do not use MyChart messaging for urgent concerns.    DIET:  We do recommend a small meal at first, but then you may proceed to your regular diet.  Drink plenty of fluids but you should avoid alcoholic beverages for 24 hours.  ACTIVITY:  You should plan to take it easy for the rest of  today and you should NOT DRIVE or use heavy machinery until tomorrow (because of the sedation medicines used during the test).    FOLLOW UP: Our staff will call the number listed on your records 48-72 hours following your procedure to check on you and address any questions or concerns that you may have regarding the information given to you following your procedure. If we do not reach you, we will leave a message.  We will attempt to reach you two times.  During this call, we will ask if you have developed any symptoms of COVID 19. If you develop any symptoms (ie: fever, flu-like symptoms, shortness of breath, cough etc.) before then, please call [phone].  If you test positive for Covid 19 in the 2 weeks post procedure, please call and report this information to [place].    If any biopsies were taken you will be contacted by phone or by letter within the next 1-3 weeks.  Please call us at [phone] if you have not heard about the biopsies in 3 weeks.    SIGNATURES/CONFIDENTIALITY: You and/or your care partner have signed paperwork which will be entered into your electronic medical record.  These signatures attest to the fact that that the information above on your After Visit Summary has been reviewed and is understood.  Full responsibility of the confidentiality of this discharge information lies with you and/or your care-partner.

## 2020-03-23 NOTE — Op Note (Signed)
[facility] Patient Name: Bartholomew Ramesh Procedure Date: 03/23/2020 11:00 AM MRN: [id number] Endoscopist: Beverley Fiedler , MD Age: 29 Referring MD:  Date of Birth: 1991-03-02 Gender: Male Account #: [id number] Procedure:                Colonoscopy Indications:              Rectal bleeding, chronic constipation improved with                            Linzess (though cost prohibitive), current Amitiza                            8 mcg BID helping but less effective than Linzess Medicines:                Monitored Anesthesia Care Procedure:                Pre-Anesthesia Assessment:                           - Prior to the procedure, a History and Physical                            was performed, and patient medications and                            allergies were reviewed. The patient's tolerance of                            previous anesthesia was also reviewed. The risks                            and benefits of the procedure and the sedation                            options and risks were discussed with the patient.                            All questions were answered, and informed consent                            was obtained. Prior Anticoagulants: The patient has                            taken no previous anticoagulant or antiplatelet                            agents. ASA Grade Assessment: II - A patient with                            mild systemic disease. After reviewing the risks                            and benefits, the patient was deemed in  satisfactory condition to undergo the procedure.                           After obtaining informed consent, the colonoscope                            was passed under direct vision. Throughout the                            procedure, the patient's blood pressure, pulse, and                            oxygen saturations were monitored continuously. The   Colonoscope was introduced through the anus and                            advanced to the terminal ileum. The colonoscopy was                            performed without difficulty. The patient tolerated                            the procedure well. The quality of the bowel                            preparation was good. The terminal ileum, ileocecal                            valve, appendiceal orifice, and rectum were                            photographed. Scope In: 11:15:53 AM Scope Out: 11:29:11 AM Scope Withdrawal Time: 0 hours 9 minutes 48 seconds  Total Procedure Duration: 0 hours 13 minutes 18 seconds  Findings:                 A posterior anal fissure was found on perianal exam.                           The terminal ileum appeared normal.                           The colon (entire examined portion) appeared normal.                           The retroflexed view of the distal rectum and anal                            verge revealed slightly hypertrophied anal papillae                            and was otherwise normal. Complications:            No immediate complications. Estimated Blood Loss:     Estimated blood loss: none. Impression:               -  Anal fissure found on perianal exam.                           - The examined portion of the ileum was normal.                           - The entire examined colon is normal.                           - The distal rectum and anal verge are normal on                            retroflexion view.                           - No specimens collected. Recommendation:           - Patient has a contact number available for                            emergencies. The signs and symptoms of potential                            delayed complications were discussed with the                            patient. Return to normal activities tomorrow.                            Written discharge instructions were provided to the                             patient.                           - Resume previous diet.                           - Continue present medications.                           - Increase Amitiza to 24 mcg twice daily (this is                            best taken with food). Office followup is                            recommended if persistent issues with chronic                            constipation or rectal bleeding.                           - Repeat colonoscopy at age 36 for screening  purposes. Beverley Fiedler, MD 03/23/2020 11:35:42 AM This report has been signed electronically.

## 2020-03-23 NOTE — Progress Notes (Signed)
Report to PACU, RN, vss, BBS= Clear.  

## 2020-03-25 ENCOUNTER — Telehealth: Payer: Self-pay

## 2020-03-25 ENCOUNTER — Telehealth: Payer: Self-pay | Admitting: *Deleted

## 2020-03-25 ENCOUNTER — Other Ambulatory Visit: Payer: Self-pay | Admitting: Internal Medicine

## 2020-03-25 NOTE — Telephone Encounter (Signed)
First follow up call attempt.  Unable to leave message on voicemail. 

## 2020-03-25 NOTE — Telephone Encounter (Signed)
No answer and VM full on 2nd follow up call.

## 2020-05-28 ENCOUNTER — Other Ambulatory Visit: Payer: Self-pay | Admitting: Physician Assistant

## 2020-10-31 ENCOUNTER — Emergency Department (HOSPITAL_COMMUNITY)
Admission: EM | Admit: 2020-10-31 | Discharge: 2020-10-31 | Disposition: A | Discharge status: Home / Self Care | Payer: BC Managed Care – PPO | Attending: Emergency Medicine | Admitting: Emergency Medicine

## 2020-10-31 ENCOUNTER — Other Ambulatory Visit: Payer: Self-pay

## 2020-10-31 ENCOUNTER — Emergency Department (HOSPITAL_COMMUNITY): Payer: BC Managed Care – PPO

## 2020-10-31 ENCOUNTER — Encounter (HOSPITAL_COMMUNITY): Payer: Self-pay

## 2020-10-31 DIAGNOSIS — J45909 Unspecified asthma, uncomplicated: Secondary | ICD-10-CM | POA: Diagnosis not present

## 2020-10-31 DIAGNOSIS — Z20822 Contact with and (suspected) exposure to covid-19: Secondary | ICD-10-CM | POA: Diagnosis not present

## 2020-10-31 DIAGNOSIS — T405X1A Poisoning by cocaine, accidental (unintentional), initial encounter: Secondary | ICD-10-CM | POA: Diagnosis not present

## 2020-10-31 DIAGNOSIS — F17210 Nicotine dependence, cigarettes, uncomplicated: Secondary | ICD-10-CM | POA: Diagnosis not present

## 2020-10-31 DIAGNOSIS — T424X1A Poisoning by benzodiazepines, accidental (unintentional), initial encounter: Secondary | ICD-10-CM | POA: Diagnosis not present

## 2020-10-31 DIAGNOSIS — T50901A Poisoning by unspecified drugs, medicaments and biological substances, accidental (unintentional), initial encounter: Secondary | ICD-10-CM | POA: Diagnosis present

## 2020-10-31 DIAGNOSIS — T50904A Poisoning by unspecified drugs, medicaments and biological substances, undetermined, initial encounter: Secondary | ICD-10-CM

## 2020-10-31 LAB — CBC WITH DIFFERENTIAL/PLATELET
Abs Immature Granulocytes: 0.04 10*3/uL (ref 0.00–0.07)
Basophils Absolute: 0 10*3/uL (ref 0.0–0.1)
Basophils Relative: 0 %
Eosinophils Absolute: 0 10*3/uL (ref 0.0–0.5)
Eosinophils Relative: 0 %
HCT: 39.3 % (ref 39.0–52.0)
Hemoglobin: 12.8 g/dL — ABNORMAL LOW (ref 13.0–17.0)
Immature Granulocytes: 0 %
Lymphocytes Relative: 6 %
Lymphs Abs: 0.7 10*3/uL (ref 0.7–4.0)
MCH: 28.9 pg (ref 26.0–34.0)
MCHC: 32.6 g/dL (ref 30.0–36.0)
MCV: 88.7 fL (ref 80.0–100.0)
Monocytes Absolute: 0.6 10*3/uL (ref 0.1–1.0)
Monocytes Relative: 5 %
Neutro Abs: 10.4 10*3/uL — ABNORMAL HIGH (ref 1.7–7.7)
Neutrophils Relative %: 89 %
Platelets: 273 10*3/uL (ref 150–400)
RBC: 4.43 MIL/uL (ref 4.22–5.81)
RDW: 13.2 % (ref 11.5–15.5)
WBC: 11.9 10*3/uL — ABNORMAL HIGH (ref 4.0–10.5)
nRBC: 0 % (ref 0.0–0.2)

## 2020-10-31 LAB — RAPID URINE DRUG SCREEN, HOSP PERFORMED
Amphetamines: NOT DETECTED
Barbiturates: NOT DETECTED
Benzodiazepines: POSITIVE — AB
Cocaine: POSITIVE — AB
Opiates: NOT DETECTED
Tetrahydrocannabinol: POSITIVE — AB

## 2020-10-31 LAB — COMPREHENSIVE METABOLIC PANEL
ALT: 40 U/L (ref 0–44)
AST: 13 U/L — ABNORMAL LOW (ref 15–41)
Albumin: 4.1 g/dL (ref 3.5–5.0)
Alkaline Phosphatase: 51 U/L (ref 38–126)
Anion gap: 9 (ref 5–15)
BUN: 8 mg/dL (ref 6–20)
CO2: 27 mmol/L (ref 22–32)
Calcium: 9 mg/dL (ref 8.9–10.3)
Chloride: 103 mmol/L (ref 98–111)
Creatinine, Ser: 0.98 mg/dL (ref 0.61–1.24)
GFR, Estimated: >60 mL/min (ref >60)
Glucose, Bld: 95 mg/dL (ref 70–99)
Potassium: 3.7 mmol/L (ref 3.5–5.1)
Sodium: 139 mmol/L (ref 135–145)
Total Bilirubin: 0.4 mg/dL (ref 0.3–1.2)
Total Protein: 6.9 g/dL (ref 6.5–8.1)

## 2020-10-31 LAB — ACETAMINOPHEN LEVEL: Acetaminophen (Tylenol), Serum: <10 ug/mL — ABNORMAL LOW (ref 10–30)

## 2020-10-31 LAB — CBG MONITORING, ED: Glucose-Capillary: 157 mg/dL — ABNORMAL HIGH (ref 70–99)

## 2020-10-31 LAB — ETHANOL: Alcohol, Ethyl (B): <10 mg/dL (ref <10)

## 2020-10-31 LAB — SALICYLATE LEVEL: Salicylate Lvl: <7 mg/dL — ABNORMAL LOW (ref 7.0–30.0)

## 2020-10-31 MED ORDER — SODIUM CHLORIDE 0.9 % IV BOLUS
1000.0000 mL | Freq: Once | INTRAVENOUS | Status: AC
  Administered 2020-10-31: 1000 mL via INTRAVENOUS

## 2020-10-31 MED ORDER — SODIUM CHLORIDE 0.9 % IV SOLN: INTRAVENOUS | Status: DC
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
## 2020-10-31 NOTE — ED Triage Notes (Signed)
Pt brought to ED via [facility] for drug overdose. Pt with slurred speech, AMS. Pt given Narcan 4 mg total, 3 mg IN and 1 mg IV.

## 2020-10-31 NOTE — ED Notes (Signed)
Oral care done.

## 2020-10-31 NOTE — ED Notes (Signed)
Pt alert and oriented. Grandmother on the way to get pt.

## 2020-10-31 NOTE — ED Provider Notes (Signed)
Patient's mental status is returned back to normal. Patient's been eating. Urine drug screen significant for cocaine benzos and THC. Suspect there may have been some fentanyl laced in with one of the the drugs cocaine being most likely. Patient stable for discharge home. Patient denies any suicidal thoughts.   Vanetta Mulders, MD 10/31/20 2219

## 2020-10-31 NOTE — Discharge Instructions (Signed)
Return for any new or worse symptoms. °

## 2020-11-01 LAB — SARS CORONAVIRUS 2 (TAT 6-24 HRS): SARS Coronavirus 2: NEGATIVE

## 2020-12-21 DEATH — deceased

## 2021-04-05 IMAGING — DX DG CHEST 1V PORT
1 series · 1 of 1 positions shown · non-contrast
Comparison: 06/24/2019

CLINICAL DATA: Overdose, slurred speech, altered mental status

EXAM:
PORTABLE CHEST 1 VIEW

[chest ap]
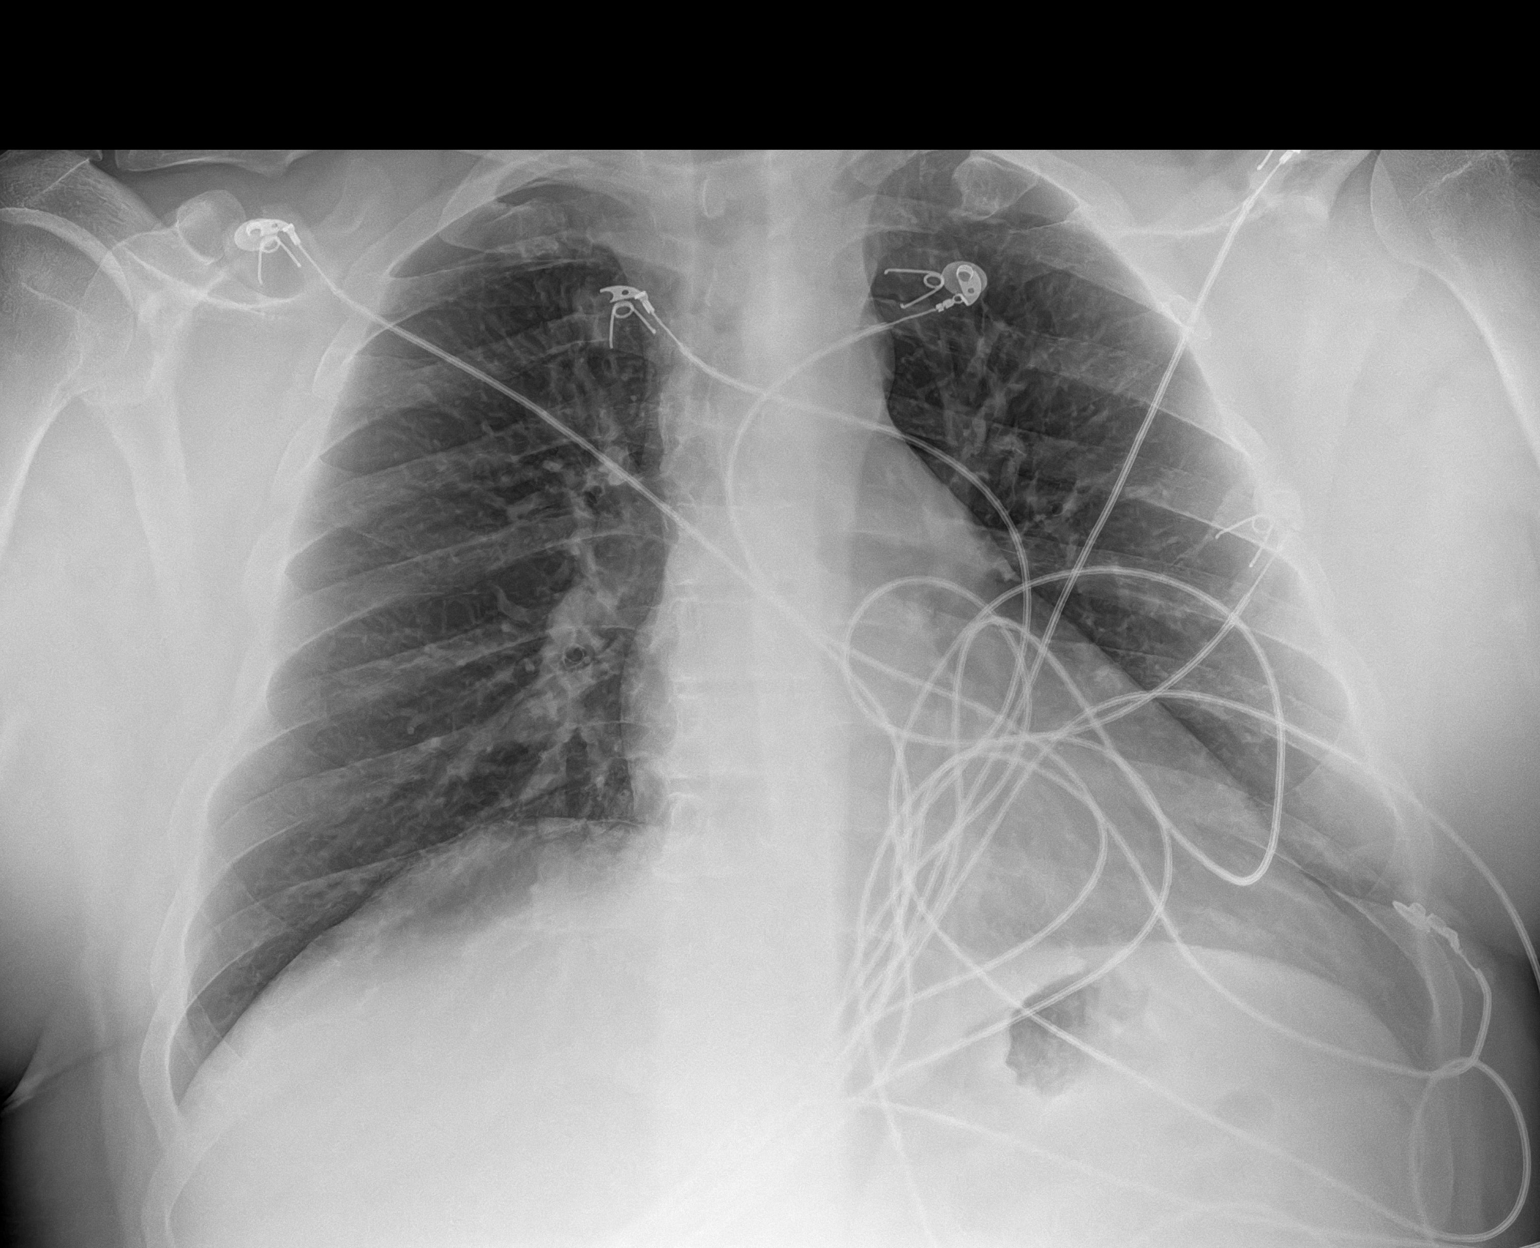

[1 of 1 positions shown; findings below may reference images not displayed]

FINDINGS: Normal heart size and vascularity. No focal airspace process,
collapse or consolidation. Negative for edema, effusion or
pneumothorax. Monitor leads overlie the chest. Trachea midline.
IMPRESSION: No active chest disease.
